# Patient Record
Sex: Male | Born: 1970 | Race: Black or African American | Hispanic: No | Marital: Married | Smoking: Never smoker
Health system: Southern US, Community
[De-identification: ages and names within clinical notes are randomized; demographics above are authoritative.]

## PROBLEM LIST (undated history)

## (undated) DIAGNOSIS — E876 Hypokalemia: Secondary | ICD-10-CM

## (undated) DIAGNOSIS — I4891 Unspecified atrial fibrillation: Secondary | ICD-10-CM

## (undated) DIAGNOSIS — I472 Ventricular tachycardia, unspecified: Secondary | ICD-10-CM

## (undated) DIAGNOSIS — R7989 Other specified abnormal findings of blood chemistry: Secondary | ICD-10-CM

## (undated) DIAGNOSIS — I428 Other cardiomyopathies: Secondary | ICD-10-CM

## (undated) DIAGNOSIS — E669 Obesity, unspecified: Secondary | ICD-10-CM

## (undated) DIAGNOSIS — I10 Essential (primary) hypertension: Secondary | ICD-10-CM

## (undated) DIAGNOSIS — K219 Gastro-esophageal reflux disease without esophagitis: Secondary | ICD-10-CM

## (undated) DIAGNOSIS — I509 Heart failure, unspecified: Secondary | ICD-10-CM

## (undated) DIAGNOSIS — Z9119 Patient's noncompliance with other medical treatment and regimen: Secondary | ICD-10-CM

## (undated) DIAGNOSIS — G4733 Obstructive sleep apnea (adult) (pediatric): Secondary | ICD-10-CM

## (undated) DIAGNOSIS — Z91199 Patient's noncompliance with other medical treatment and regimen due to unspecified reason: Secondary | ICD-10-CM

---

## 1989-04-29 HISTORY — PX: ABDOMINAL SURGERY: SHX537

## 2006-08-29 HISTORY — PX: CARDIAC DEFIBRILLATOR PLACEMENT: SHX171

## 2013-04-26 ENCOUNTER — Encounter (HOSPITAL_COMMUNITY)
Admission: EM | Disposition: A | Discharge status: Home / Self Care | Payer: Self-pay | Source: Home / Self Care | Attending: Internal Medicine

## 2013-04-26 ENCOUNTER — Inpatient Hospital Stay (HOSPITAL_COMMUNITY): Payer: Medicaid Other | Admitting: Anesthesiology

## 2013-04-26 ENCOUNTER — Encounter (HOSPITAL_COMMUNITY): Payer: Self-pay | Admitting: Anesthesiology

## 2013-04-26 ENCOUNTER — Encounter (HOSPITAL_COMMUNITY): Payer: Self-pay | Admitting: Emergency Medicine

## 2013-04-26 ENCOUNTER — Inpatient Hospital Stay (HOSPITAL_COMMUNITY)
Admission: EM | Admit: 2013-04-26 | Discharge: 2013-04-27 | DRG: 314 | Disposition: A | Discharge status: Home / Self Care | Payer: Medicaid Other | Attending: Internal Medicine | Admitting: Internal Medicine

## 2013-04-26 ENCOUNTER — Emergency Department (HOSPITAL_COMMUNITY): Payer: Medicaid Other

## 2013-04-26 DIAGNOSIS — E876 Hypokalemia: Secondary | ICD-10-CM | POA: Diagnosis present

## 2013-04-26 DIAGNOSIS — Y9210 Unspecified residential institution as the place of occurrence of the external cause: Secondary | ICD-10-CM | POA: Diagnosis present

## 2013-04-26 DIAGNOSIS — G4733 Obstructive sleep apnea (adult) (pediatric): Secondary | ICD-10-CM | POA: Diagnosis present

## 2013-04-26 DIAGNOSIS — I480 Paroxysmal atrial fibrillation: Secondary | ICD-10-CM

## 2013-04-26 DIAGNOSIS — I472 Ventricular tachycardia, unspecified: Secondary | ICD-10-CM

## 2013-04-26 DIAGNOSIS — T82897A Other specified complication of cardiac prosthetic devices, implants and grafts, initial encounter: Principal | ICD-10-CM | POA: Diagnosis present

## 2013-04-26 DIAGNOSIS — I509 Heart failure, unspecified: Secondary | ICD-10-CM | POA: Diagnosis present

## 2013-04-26 DIAGNOSIS — Z9119 Patient's noncompliance with other medical treatment and regimen: Secondary | ICD-10-CM

## 2013-04-26 DIAGNOSIS — I4901 Ventricular fibrillation: Secondary | ICD-10-CM

## 2013-04-26 DIAGNOSIS — Z4502 Encounter for adjustment and management of automatic implantable cardiac defibrillator: Secondary | ICD-10-CM

## 2013-04-26 DIAGNOSIS — I428 Other cardiomyopathies: Secondary | ICD-10-CM

## 2013-04-26 DIAGNOSIS — Z91199 Patient's noncompliance with other medical treatment and regimen due to unspecified reason: Secondary | ICD-10-CM

## 2013-04-26 DIAGNOSIS — Z8249 Family history of ischemic heart disease and other diseases of the circulatory system: Secondary | ICD-10-CM

## 2013-04-26 DIAGNOSIS — Z95810 Presence of automatic (implantable) cardiac defibrillator: Secondary | ICD-10-CM

## 2013-04-26 DIAGNOSIS — I4891 Unspecified atrial fibrillation: Secondary | ICD-10-CM | POA: Diagnosis present

## 2013-04-26 DIAGNOSIS — Z6839 Body mass index (BMI) 39.0-39.9, adult: Secondary | ICD-10-CM

## 2013-04-26 DIAGNOSIS — I4729 Other ventricular tachycardia: Secondary | ICD-10-CM | POA: Diagnosis present

## 2013-04-26 DIAGNOSIS — E669 Obesity, unspecified: Secondary | ICD-10-CM | POA: Diagnosis present

## 2013-04-26 DIAGNOSIS — I10 Essential (primary) hypertension: Secondary | ICD-10-CM | POA: Diagnosis present

## 2013-04-26 DIAGNOSIS — Z833 Family history of diabetes mellitus: Secondary | ICD-10-CM

## 2013-04-26 DIAGNOSIS — Y831 Surgical operation with implant of artificial internal device as the cause of abnormal reaction of the patient, or of later complication, without mention of misadventure at the time of the procedure: Secondary | ICD-10-CM | POA: Diagnosis present

## 2013-04-26 HISTORY — DX: Patient's noncompliance with other medical treatment and regimen due to unspecified reason: Z91.199

## 2013-04-26 HISTORY — DX: Other specified abnormal findings of blood chemistry: R79.89

## 2013-04-26 HISTORY — DX: Essential (primary) hypertension: I10

## 2013-04-26 HISTORY — DX: Hypokalemia: E87.6

## 2013-04-26 HISTORY — DX: Ventricular tachycardia, unspecified: I47.20

## 2013-04-26 HISTORY — DX: Ventricular tachycardia: I47.2

## 2013-04-26 HISTORY — DX: Obstructive sleep apnea (adult) (pediatric): G47.33

## 2013-04-26 HISTORY — DX: Heart failure, unspecified: I50.9

## 2013-04-26 HISTORY — PX: IMPLANTABLE CARDIOVERTER DEFIBRILLATOR THRESHOLD CHECK: SHX5472

## 2013-04-26 HISTORY — DX: Obesity, unspecified: E66.9

## 2013-04-26 HISTORY — DX: Other cardiomyopathies: I42.8

## 2013-04-26 HISTORY — DX: Patient's noncompliance with other medical treatment and regimen: Z91.19

## 2013-04-26 HISTORY — DX: Unspecified atrial fibrillation: I48.91

## 2013-04-26 LAB — PROTIME-INR: Prothrombin Time: 13.2 seconds (ref 11.6–15.2)

## 2013-04-26 LAB — BASIC METABOLIC PANEL
BUN: 13 mg/dL (ref 6–23)
CO2: 28 mEq/L (ref 19–32)
Calcium: 9.6 mg/dL (ref 8.4–10.5)
Chloride: 99 mEq/L (ref 96–112)
Creatinine, Ser: 1.15 mg/dL (ref 0.50–1.35)
GFR calc Af Amer: 88 mL/min — ABNORMAL LOW (ref >90)
GFR calc non Af Amer: 76 mL/min — ABNORMAL LOW (ref >90)
GFR calc non Af Amer: >90 mL/min (ref >90)
Glucose, Bld: 112 mg/dL — ABNORMAL HIGH (ref 70–99)
Potassium: 3.1 mEq/L — ABNORMAL LOW (ref 3.5–5.1)

## 2013-04-26 LAB — CBC
MCH: 30 pg (ref 26.0–34.0)
MCHC: 34.5 g/dL (ref 30.0–36.0)
MCHC: 34.2 g/dL (ref 30.0–36.0)
MCV: 87.6 fL (ref 78.0–100.0)
Platelets: 227 10*3/uL (ref 150–400)
Platelets: 210 10*3/uL (ref 150–400)
RDW: 13.1 % (ref 11.5–15.5)
WBC: 5 10*3/uL (ref 4.0–10.5)

## 2013-04-26 LAB — TROPONIN I: Troponin I: <0.3 ng/mL (ref <0.30)

## 2013-04-26 LAB — MAGNESIUM: Magnesium: 2.1 mg/dL (ref 1.5–2.5)

## 2013-04-26 LAB — POCT I-STAT TROPONIN I: Troponin i, poc: 0.07 ng/mL (ref 0.00–0.08)

## 2013-04-26 LAB — CREATININE, SERUM: Creatinine, Ser: 0.92 mg/dL (ref 0.50–1.35)

## 2013-04-26 LAB — MRSA PCR SCREENING: MRSA by PCR: NEGATIVE

## 2013-04-26 SURGERY — IMPLANTABLE CARDIOVERTER DEFIBRILLATOR THRESHOLD CHECK
Anesthesia: Monitor Anesthesia Care

## 2013-04-26 MED ORDER — MIDAZOLAM HCL 5 MG/5ML IJ SOLN
INTRAMUSCULAR | Status: DC | PRN
  Administered 2013-04-26: 2 mg via INTRAVENOUS

## 2013-04-26 MED ORDER — METOPROLOL SUCCINATE ER 100 MG PO TB24
200.0000 mg | ORAL_TABLET | Freq: Every day | ORAL | Status: DC
Start: 2013-04-26 — End: 2013-04-26

## 2013-04-26 MED ORDER — MAGNESIUM OXIDE 400 MG PO TABS: 400.0000 mg | ORAL_TABLET | Freq: Every day | ORAL | Status: DC

## 2013-04-26 MED ORDER — SODIUM CHLORIDE 0.9 % IJ SOLN
3.0000 mL | Freq: Two times a day (BID) | INTRAMUSCULAR | Status: DC
  Administered 2013-04-26 – 2013-04-27 (×2): 3 mL via INTRAVENOUS

## 2013-04-26 MED ORDER — FUROSEMIDE 40 MG PO TABS
40.0000 mg | ORAL_TABLET | Freq: Two times a day (BID) | ORAL | Status: DC
  Administered 2013-04-27: 40 mg via ORAL
  Filled 2013-04-26 (×5): qty 1

## 2013-04-26 MED ORDER — SODIUM CHLORIDE 0.9 % IV SOLN
INTRAVENOUS | Status: DC | PRN
  Administered 2013-04-26: 16:00:00 via INTRAVENOUS

## 2013-04-26 MED ORDER — ASPIRIN EC 81 MG PO TBEC
81.0000 mg | DELAYED_RELEASE_TABLET | Freq: Every day | ORAL | Status: DC
  Administered 2013-04-27: 81 mg via ORAL
  Filled 2013-04-26: qty 1

## 2013-04-26 MED ORDER — ACETAMINOPHEN 325 MG PO TABS: 650.0000 mg | ORAL_TABLET | ORAL | Status: DC | PRN

## 2013-04-26 MED ORDER — ONDANSETRON HCL 4 MG/2ML IJ SOLN: 4.0000 mg | Freq: Four times a day (QID) | INTRAMUSCULAR | Status: DC | PRN

## 2013-04-26 MED ORDER — METOPROLOL SUCCINATE ER 100 MG PO TB24
200.0000 mg | ORAL_TABLET | Freq: Every day | ORAL | Status: DC
  Administered 2013-04-26 – 2013-04-27 (×2): 200 mg via ORAL
  Filled 2013-04-26 (×3): qty 2

## 2013-04-26 MED ORDER — POTASSIUM CHLORIDE CRYS ER 20 MEQ PO TBCR
40.0000 meq | EXTENDED_RELEASE_TABLET | Freq: Once | ORAL | Status: AC
  Administered 2013-04-26: 40 meq via ORAL
  Filled 2013-04-26: qty 2

## 2013-04-26 MED ORDER — SOTALOL HCL 120 MG PO TABS
120.0000 mg | ORAL_TABLET | Freq: Two times a day (BID) | ORAL | Status: DC
  Administered 2013-04-26 – 2013-04-27 (×2): 120 mg via ORAL
  Filled 2013-04-26 (×5): qty 1

## 2013-04-26 MED ORDER — NITROGLYCERIN 0.4 MG SL SUBL: 0.4000 mg | SUBLINGUAL_TABLET | SUBLINGUAL | Status: DC | PRN

## 2013-04-26 MED ORDER — POTASSIUM CHLORIDE CRYS ER 20 MEQ PO TBCR
40.0000 meq | EXTENDED_RELEASE_TABLET | Freq: Two times a day (BID) | ORAL | Status: DC
  Administered 2013-04-26 – 2013-04-27 (×2): 40 meq via ORAL
  Filled 2013-04-26 (×3): qty 2

## 2013-04-26 MED ORDER — SODIUM CHLORIDE 0.9 % IJ SOLN: 3.0000 mL | INTRAMUSCULAR | Status: DC | PRN

## 2013-04-26 MED ORDER — MAGNESIUM OXIDE 400 (241.3 MG) MG PO TABS
400.0000 mg | ORAL_TABLET | Freq: Every day | ORAL | Status: DC
  Administered 2013-04-27: 400 mg via ORAL
  Filled 2013-04-26 (×3): qty 1

## 2013-04-26 MED ORDER — HYDRALAZINE HCL 25 MG PO TABS
25.0000 mg | ORAL_TABLET | Freq: Three times a day (TID) | ORAL | Status: DC
  Administered 2013-04-26 – 2013-04-27 (×2): 25 mg via ORAL
  Filled 2013-04-26 (×6): qty 1

## 2013-04-26 MED ORDER — HEPARIN SODIUM (PORCINE) 5000 UNIT/ML IJ SOLN
5000.0000 [IU] | Freq: Three times a day (TID) | INTRAMUSCULAR | Status: DC
  Filled 2013-04-26 (×6): qty 1

## 2013-04-26 MED ORDER — SPIRONOLACTONE 25 MG PO TABS
25.0000 mg | ORAL_TABLET | Freq: Every day | ORAL | Status: DC
  Administered 2013-04-26 – 2013-04-27 (×2): 25 mg via ORAL
  Filled 2013-04-26 (×3): qty 1

## 2013-04-26 MED ORDER — SODIUM CHLORIDE 0.9 % IV SOLN: 250.0000 mL | INTRAVENOUS | Status: DC | PRN

## 2013-04-26 MED ORDER — LISINOPRIL 40 MG PO TABS
40.0000 mg | ORAL_TABLET | Freq: Every day | ORAL | Status: DC
Start: 2013-04-26 — End: 2013-04-27
  Administered 2013-04-26 – 2013-04-27 (×2): 40 mg via ORAL
  Filled 2013-04-26 (×5): qty 1

## 2013-04-26 MED ORDER — PROPOFOL INFUSION 10 MG/ML OPTIME
INTRAVENOUS | Status: DC | PRN
  Administered 2013-04-26: 75 ug/kg/min via INTRAVENOUS
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
## 2013-04-26 NOTE — Progress Notes (Signed)
ED CM noted medicaid pt without pcp listed and without a recent scanned medicaid card in EPIC.  Notes indicates pt recently moved to this area but without obtaining, updating pcp status.  Pt needs referral back to DSS for assistance with locating an accepting medicaid provider in [place] and to transfer [employer] from county to county as needed  Cm spoke with Clydie Braun [facility] ED RN who will discuss this with the pt

## 2013-04-26 NOTE — ED Notes (Signed)
Pt has taken own meds prior to nurse entering room-- SOTALOL 80 mg 1 1/2 tab and HYDRALAZINE 25mg . States  takes meds on time when at home.

## 2013-04-26 NOTE — Transfer of Care (Signed)
Immediate Anesthesia Transfer of Care Note  Patient: Kyle Vincent  Procedure(s) Performed: Procedure(s): IMPLANTABLE CARDIOVERTER DEFIBRILLATOR THRESHOLD CHECK (N/A)  Patient Location: PACU  Anesthesia Type:MAC  Level of Consciousness: awake, alert  and oriented  Airway & Oxygen Therapy: Patient Spontanous Breathing and Patient connected to nasal cannula oxygen  Post-op Assessment: Report given to PACU RN, Post -op Vital signs reviewed and stable and Patient moving all extremities X 4  Post vital signs: Reviewed and stable  Complications: No apparent anesthesia complications

## 2013-04-26 NOTE — ED Notes (Signed)
Pt states that his AICD is also a pacemaker.

## 2013-04-26 NOTE — Preoperative (Signed)
Beta Blockers   Reason not to administer Beta Blockers:Sotalol taken at 9:00am on 04/26/13

## 2013-04-26 NOTE — ED Notes (Signed)
Using bedside commode--wife remains at bedside-- denies any pain, RSR -- with multifocal PVCs.

## 2013-04-26 NOTE — Anesthesia Preprocedure Evaluation (Addendum)
Anesthesia Evaluation  Patient identified by MRN, date of birth, ID band Patient awake    Reviewed: Allergy & Precautions, H&P , NPO status , Patient's Chart, lab work & pertinent test results, reviewed documented beta blocker date and time   Airway Mallampati: II TM Distance: >3 FB Neck ROM: Full    Dental  (+) Teeth Intact   Pulmonary  breath sounds clear to auscultation        Cardiovascular hypertension, Pt. on home beta blockers +CHF + dysrhythmias Atrial Fibrillation and Ventricular Tachycardia + Cardiac Defibrillator Rhythm:Regular Rate:Normal     Neuro/Psych    GI/Hepatic   Endo/Other  Morbid obesity  Renal/GU      Musculoskeletal   Abdominal (+) + obese,   Peds  Hematology   Anesthesia Other Findings   Reproductive/Obstetrics                         Anesthesia Physical Anesthesia Plan  ASA: III  Anesthesia Plan: MAC   Post-op Pain Management:    Induction: Intravenous  Airway Management Planned: Simple Face Mask  Additional Equipment:   Intra-op Plan:   Post-operative Plan:   Informed Consent: I have reviewed the patients History and Physical, chart, labs and discussed the procedure including the risks, benefits and alternatives for the proposed anesthesia with the patient or authorized representative who has indicated his/her understanding and acceptance.     Plan Discussed with: CRNA and Surgeon  Anesthesia Plan Comments:         Anesthesia Quick Evaluation

## 2013-04-26 NOTE — Brief Op Note (Addendum)
DFT testing performed Riata ST lead externalization observed  Successful defibrillation of VF with adequate detection and DFT of <21J.  See dictation # [id number] for full details.  Anticipate discharge to home in AM. He has scheduled follow-up with Dr [name] this Tuesday  (04/30/13) at 11:30 am to discuss potential lead extraction and new lead placement given the patients young age and recurrent VT/VF.  No driving x 6 months (patient is aware).
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
## 2013-04-26 NOTE — Anesthesia Procedure Notes (Signed)
Procedure Name: MAC Date/Time: 04/26/2013 4:15 PM Performed by: Quentin Ore Pre-anesthesia Checklist: Patient identified, Emergency Drugs available, Suction available, Patient being monitored and Timeout performed Patient Re-evaluated:Patient Re-evaluated prior to inductionOxygen Delivery Method: Simple face mask Preoxygenation: Pre-oxygenation with 100% oxygen Intubation Type: IV induction Placement Confirmation: positive ETCO2

## 2013-04-26 NOTE — ED Notes (Signed)
This RN spoke with a representative at [facility], where the pt had his AICD placed, to try and locate the AICD's serial number so the interrogation of the device that was done today can be faxed to our facility. This RN gave the representative the ED main phone number, and she said that she would call back with the appropriate information.

## 2013-04-26 NOTE — Progress Notes (Signed)
Pt alert and oriented ,VSS monitor SR

## 2013-04-26 NOTE — Anesthesia Postprocedure Evaluation (Signed)
  Anesthesia Post-op Note  Patient: Kyle Vincent  Procedure(s) Performed: Procedure(s): IMPLANTABLE CARDIOVERTER DEFIBRILLATOR THRESHOLD CHECK (N/A)  Patient Location: Cath Lab  Anesthesia Type:MAC  Level of Consciousness: awake, alert  and oriented  Airway and Oxygen Therapy: Patient Spontanous Breathing  Post-op Pain: none  Post-op Assessment: Post-op Vital signs reviewed, Patient's Cardiovascular Status Stable, Respiratory Function Stable and Patent Airway  Post-op Vital Signs: Reviewed and stable  Complications: No apparent anesthesia complications

## 2013-04-26 NOTE — ED Notes (Signed)
Pt BIB EMS.  Report per pt's wife. Pt was asleep, and when wife awoke, saw that the pt was "unresponsive" and was having difficulty breathing. Pt's wife then reports that she heard the pt's AICD fire twice, after which the pt came around.  Pt does not remember any of these events.

## 2013-04-27 ENCOUNTER — Encounter (HOSPITAL_COMMUNITY): Payer: Self-pay | Admitting: Physician Assistant

## 2013-04-27 DIAGNOSIS — Z0389 Encounter for observation for other suspected diseases and conditions ruled out: Secondary | ICD-10-CM

## 2013-04-27 LAB — LIPID PANEL
Cholesterol: 159 mg/dL (ref 0–200)
LDL Cholesterol: 103 mg/dL — ABNORMAL HIGH (ref 0–99)
VLDL: 21 mg/dL (ref 0–40)

## 2013-04-27 LAB — COMPREHENSIVE METABOLIC PANEL
Albumin: 3.3 g/dL — ABNORMAL LOW (ref 3.5–5.2)
Alkaline Phosphatase: 58 U/L (ref 39–117)
BUN: 15 mg/dL (ref 6–23)
CO2: 25 mEq/L (ref 19–32)
Chloride: 105 mEq/L (ref 96–112)
GFR calc non Af Amer: >90 mL/min (ref >90)
Potassium: 3.9 mEq/L (ref 3.5–5.1)
Total Bilirubin: 0.7 mg/dL (ref 0.3–1.2)

## 2013-04-27 MED ORDER — RIVAROXABAN 20 MG PO TABS: 20.0000 mg | ORAL_TABLET | Freq: Every day | ORAL | Status: DC

## 2013-04-27 MED ORDER — POTASSIUM CHLORIDE CRYS ER 20 MEQ PO TBCR: 40.0000 meq | EXTENDED_RELEASE_TABLET | Freq: Two times a day (BID) | ORAL | Status: DC
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
## 2013-04-27 NOTE — Progress Notes (Signed)
Pt has refused his heparin sq injection during this 7p-7a shift saying he is only going to be here for one night so he does not need it and, if he were to be here for more than a night then he would take it. Pt was instructed on the importance of him receiving this medication as it is a part of his plan of care and directed by the MD.

## 2013-04-27 NOTE — Progress Notes (Signed)
Nutrition Brief Note  Patient identified on the Malnutrition Screening Tool (MST) Report. Pt identified as unsure if he had weight loss, however denies poor appetite.  Wt Readings from Last 15 Encounters:  04/27/13 297 lb 6.4 oz (134.9 kg)  04/27/13 297 lb 6.4 oz (134.9 kg)    Body mass index is 39.25 kg/(m^2). Patient meets criteria for Obese Class II based on current BMI.   Current diet order is Heart Healthy. Labs and medications reviewed.   No nutrition interventions warranted at this time. If nutrition issues arise, please consult RD.   Jarold Motto MS, RD, LDN Pager: [phone] After-hours pager: [phone]

## 2013-04-27 NOTE — Progress Notes (Signed)
Pt explained discharge instructions; all questions answered; medications reviewed and follow-up appointments reviewed; pt alert and oriented; IV removed with no complications noted; patient transported by wheelchair to main entrance for discharge home

## 2013-04-27 NOTE — Op Note (Signed)
Kyle Vincent, Kyle Vincent NO.:  [id number]  MEDICAL RECORD NO.:  [id number]  LOCATION:  2C05C                        FACILITY:  [facility]  PHYSICIAN:  [name], MD       DATE OF BIRTH:  Jun 28, 1971  DATE OF PROCEDURE:  04/26/2013 DATE OF DISCHARGE:                              OPERATIVE REPORT   PREPROCEDURE DIAGNOSES: 1. Ventricular tachycardia and ventricular fibrillation. 2. ICD malfunction.  POSTPROCEDURE DIAGNOSES: 1. Ventricular tachycardia and ventricular fibrillation. 2. ICD undersensing of ventricular fibrillation. 3. Riata defibrillator lead with wire externalization  PROCEDURES: 1. Defibrillation threshold testing. 2. ICD system fluoroscopy.  INTRODUCTION:  Kyle Vincent is a pleasant 42 year old gentleman with a nonischemic cardiomyopathy and prior appropriate therapy for ventricular tachycardia and ventricular fibrillation.  He had his ICD implanted at [facility] previously by Dr. [name] on February 27, 2007.  He apparently had some T-wave over sensing and therefore had his device reprogramed with threshold start at 75% and decay delay of 125.  Last night, the patient had ventricular tachycardia for which he did not adequately sent due to sensing programing.  His ventricular tachycardia then degenerated into ventricular fibrillation. He did eventually detect ventricular fibrillation.  A 20-joule shock was unsuccessful followed by an unsuccessful 25-joule shock.  He then received a 36-joule shock, which successfully defibrillated the patient.  He is now brought to [facility] for further management.  It was felt prudent at this time to re-program his device and proceed with defibrillation threshold testing.  DESCRIPTION OF PROCEDURE:  Informed written consent was obtained and the patient was brought to the electrophysiology lab in the fasting state. He was adequately sedated with propofol as outlined in the anesthesia report.  Adequate IV access and airway  support were assured. Fluoroscopy was then performed of the patient's defibrillator system. This demonstrated a dual-chamber ICD in place.  The patient had an 1688 atrial lead observed in the free wall of the right atrium.  He also had a Web designer Riata ST 7000 lead fixed into the right ventricular apical septum.  There was observed externalization of the distal portion of the defibrillator lead conductor.  The patient's defibrillator was programed back to nominal sensing initially with the threshold start returned to 62% and decay delay of 60%.  Atrial pacing was performed multiple times in various heart rates and we could not demonstrate inappropriate T-wave sensing today.  I elected to perform DFT testing initially with a sensitivity of 0.8 mV. Ventricular fibrillation was induced with a T-wave shock.  Adequate sensing of ventricular fibrillation was observed.  However, the patient did have some dropout.  A single 24-joule shock (700 V) was successful in defibrillating the patient without a delay in time to detection/ shock delivery.  After a 5-minute recovery phase, the patient's shock waveform was adjusted with a first phase tilt of 4.0 milliseconds and a second phase of 3.5 milliseconds to optimize his waveform.  Sensitivity was then changed to 0.4 mV. Ventricular fibrillation was again induced.  Adequate sensing of ventricular fibrillation was observed with a very little dropout.  A single 21-joule shock was successful in defibrillating the patient (650 V).  The impedance was 42 ohms with a charge time of 6.1 seconds.  The patient remained in sinus rhythm thereafter.  There were no early apparent complications.  CONCLUSIONS: 1. Externalization of a Riata ST lead is observed. 2. Successful reprograming of the device today to adequately sense     ventricular fibrillation. 3. DFT today is less than or equal to 21 joules. 4. No early apparent  complications.  RECOMMENDATIONS:  I had a discussion with Dr. [name] about the patient's externalized lead.  As he has had prior appropriate therapies and given the patient's young age, I think that it may be very appropriate to consider extraction of this lead and placement of a new defibrillator lead.  I will arrange for the patient to be evaluated next week by Dr. [name] in the office where this discussion can be had further.     [name], MD     JA/MEDQ  D:  04/26/2013  T:  04/27/2013  Job:  [id number]  cc:   Dr. Dede Query
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
## 2013-04-30 ENCOUNTER — Ambulatory Visit (INDEPENDENT_AMBULATORY_CARE_PROVIDER_SITE_OTHER): Payer: Medicaid Other | Admitting: Internal Medicine

## 2013-04-30 ENCOUNTER — Encounter: Payer: Self-pay | Admitting: Internal Medicine

## 2013-04-30 VITALS — BP 132/90 | HR 68 | Ht 71.0 in | Wt 294.0 lb

## 2013-04-30 DIAGNOSIS — I472 Ventricular tachycardia: Secondary | ICD-10-CM

## 2013-04-30 DIAGNOSIS — T82118D Breakdown (mechanical) of other cardiac electronic device, subsequent encounter: Secondary | ICD-10-CM

## 2013-04-30 DIAGNOSIS — Z5189 Encounter for other specified aftercare: Secondary | ICD-10-CM

## 2013-04-30 DIAGNOSIS — Z95810 Presence of automatic (implantable) cardiac defibrillator: Secondary | ICD-10-CM | POA: Insufficient documentation

## 2013-04-30 DIAGNOSIS — I428 Other cardiomyopathies: Secondary | ICD-10-CM

## 2013-04-30 DIAGNOSIS — I5022 Chronic systolic (congestive) heart failure: Secondary | ICD-10-CM | POA: Insufficient documentation

## 2013-04-30 NOTE — Assessment & Plan Note (Signed)
He has a combination of difficult problems. I have discussed the issues with the patient and his wife. He has elevated thresholds requiring sotalol. He has had T wave oversensing requiring reprogramming which resulting in undersensing of his ventricular arrhythmias. He has been reprogrammed and now has appropriate sensing of VT. I have not changed his programming. I have discussed the issues of externalization of his ICD lead. For now, I have recommended he undergo watchful waiting. When his device reaches ERI, we will then decide on a new lead vs removal of his old lead and insertion of a new one.

## 2013-04-30 NOTE — Assessment & Plan Note (Signed)
His heart failure symptoms are well controlled, class 2. No change in medical or dietary therapy.
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
## 2013-04-30 NOTE — Patient Instructions (Addendum)
Your physician recommends that you schedule a follow-up appointment in: 3 months with Dr Taylor  

## 2013-05-03 ENCOUNTER — Telehealth (HOSPITAL_COMMUNITY): Payer: Self-pay | Admitting: Cardiology

## 2013-05-03 DIAGNOSIS — I5022 Chronic systolic (congestive) heart failure: Secondary | ICD-10-CM

## 2013-05-03 NOTE — Telephone Encounter (Signed)
Opened in ERROR

## 2013-05-03 NOTE — Telephone Encounter (Signed)
Order placed for upcoming ECHo

## 2013-05-28 ENCOUNTER — Ambulatory Visit (HOSPITAL_COMMUNITY)
Admission: RE | Admit: 2013-05-28 | Discharge: 2013-05-28 | Disposition: A | Discharge status: Home / Self Care | Payer: Medicaid Other | Source: Ambulatory Visit | Attending: Internal Medicine | Admitting: Internal Medicine

## 2013-05-28 ENCOUNTER — Ambulatory Visit (HOSPITAL_COMMUNITY)
Admission: RE | Admit: 2013-05-28 | Discharge: 2013-05-28 | Disposition: A | Discharge status: Home / Self Care | Payer: Medicaid Other | Source: Ambulatory Visit | Attending: Cardiology | Admitting: Cardiology

## 2013-05-28 VITALS — BP 130/84 | HR 70 | Wt 294.0 lb

## 2013-05-28 DIAGNOSIS — G4733 Obstructive sleep apnea (adult) (pediatric): Secondary | ICD-10-CM | POA: Insufficient documentation

## 2013-05-28 DIAGNOSIS — I5022 Chronic systolic (congestive) heart failure: Secondary | ICD-10-CM

## 2013-05-28 DIAGNOSIS — I4891 Unspecified atrial fibrillation: Secondary | ICD-10-CM | POA: Insufficient documentation

## 2013-05-28 DIAGNOSIS — I369 Nonrheumatic tricuspid valve disorder, unspecified: Secondary | ICD-10-CM

## 2013-05-28 DIAGNOSIS — Z950 Presence of cardiac pacemaker: Secondary | ICD-10-CM | POA: Insufficient documentation

## 2013-05-28 DIAGNOSIS — I10 Essential (primary) hypertension: Secondary | ICD-10-CM | POA: Insufficient documentation

## 2013-05-28 DIAGNOSIS — Z79899 Other long term (current) drug therapy: Secondary | ICD-10-CM | POA: Insufficient documentation

## 2013-05-28 DIAGNOSIS — R946 Abnormal results of thyroid function studies: Secondary | ICD-10-CM | POA: Insufficient documentation

## 2013-05-28 DIAGNOSIS — I472 Ventricular tachycardia: Secondary | ICD-10-CM

## 2013-05-28 DIAGNOSIS — I509 Heart failure, unspecified: Secondary | ICD-10-CM | POA: Insufficient documentation

## 2013-05-28 DIAGNOSIS — I428 Other cardiomyopathies: Secondary | ICD-10-CM

## 2013-05-28 MED ORDER — METOPROLOL SUCCINATE ER 200 MG PO TB24: 200.0000 mg | ORAL_TABLET | Freq: Every day | ORAL | Status: DC

## 2013-05-28 MED ORDER — ISOSORBIDE MONONITRATE ER 30 MG PO TB24: 30.0000 mg | ORAL_TABLET | Freq: Every day | ORAL | Status: DC

## 2013-05-28 MED ORDER — SPIRONOLACTONE 25 MG PO TABS: 25.0000 mg | ORAL_TABLET | Freq: Every day | ORAL | Status: DC

## 2013-05-28 NOTE — Progress Notes (Signed)
  Echocardiogram 2D Echocardiogram has been performed.  Kyle Vincent 05/28/2013, 1:12 PM

## 2013-05-28 NOTE — Patient Instructions (Addendum)
Follow up in 3 months   Do the following things EVERYDAY: 1) Weigh yourself in the morning before breakfast. Write it down and keep it in a log. 2) Take your medicines as prescribed 3) Eat low salt foods-Limit salt (sodium) to 2000 mg per day.  4) Stay as active as you can everyday 5) Limit all fluids for the day to less than 2 liters  

## 2013-05-28 NOTE — Progress Notes (Signed)
Patient ID: Kyle Vincent, male   DOB: [birth year], 42 y.o.   MRN: [id number]  Weight Range  297 pounds  Baseline proBNP   EP: Dr [name]  HPI: Kyle Vincent is a 42 year old male with a history of non-ischemic cardiomyopathy by report who is s/p dual chamber ICD insertion in July of 2008 for secondary prevention. His other past medical history is significant for atrial fibrillation and inappropriate therapy for TWOS (t-wave oversensing). He reported diagnosis of congestive heart failure and hypertension in 2007.  NICM is thought to be due to HTN.   He was placed on medication, but per his report was non compliant. He subsequently had a syncopal spell in 2008 while wrestling with his cousin. [name] He had ROSC and EMS was called. His ICD was placed at that time. He has been maintained on good medical therapy since that time. He says that he had a cardiac catheterization last in 2012 which did not show significant CAD.  He has received inappropriate therapy for TWOS. He has also had appropriate therapy for ventricular tachycardia and has been maintained on sotalol. He was also found to have atrial fibrillation through device interrogation in the past and was previously on warfarin, now on Xarelto.  Admitted [facility] 8/29/ through  04/27/13 after his device fired. He was found to have undersensing of VT with decay to ventricular fibrillation and appropriate therapy for VF at that time.  He had been off sotalol and toprol for 2 days. Defibrillator was reprogrammed. Discharge weight 297 pounds.   He was referred to CHF clinic for evaluation. Relocated to [place] 2 months ago. Had previously been followed at [facility]. Denies SOB/PND/Orthopnea. Not currently using CPAP because it is broken. Taking all medications. Not exercising.  BP is under good control.   Labs (8/14): K 3.9, Creatinine 0.98, Cholesterol 159, Triglycerides 106, HDL 35, LDL 103, TSH low, free T4 normal  ECG: NSR, normal QT interval  SH: Married with 5 children.  Disabled He does not smoke or drink alcohol.  Originally from [place].  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   FH: Father had heart disease (not sure of details)  ROS: All systems negative except as listed in HPI, PMH and Problem List.  PMH: 1. Nonischemic CMP: Possibly due to uncontrolled HTN x years.  LHC (2012) per patient's report did not show significant coronary disease.  Echo (9/14) with EF 25-30%, moderately dilated LV, mild LVH, mildly decreased RV systolic function.  2. St Jude dual chamber ICD: Patient had problems initially with T wave oversensing.  Then was admitted in 8/14 with undersensing of VT that resulted in degeneration to VF and ICD discharge.  ICD reprogrammed.  3. Ventricular tachycardia: On sotalol.  4. Atrial fibrillation: Paroxysmal, noted by ICD interrogation. He is on Xarelto.  5. HTN 6. OSA: CPAP is broken.   Current Outpatient Prescriptions  Medication Sig Dispense Refill  . furosemide (LASIX) 40 MG tablet Take 40 mg by mouth 2 (two) times daily.      . hydrALAZINE (APRESOLINE) 25 MG tablet Take 25 mg by mouth 3 (three) times daily.      [name] lisinopril (PRINIVIL,ZESTRIL) 40 MG tablet Take 40 mg by mouth daily.      . magnesium oxide (MAG-OX) 400 MG tablet Take 400 mg by mouth daily.      . metoprolol (TOPROL-XL) 200 MG 24 hr tablet Take 200 mg by mouth daily.      . potassium chloride SA (K-DUR,KLOR-CON) 20 MEQ tablet Take  2 tablets (40 mEq total) by mouth 2 (two) times daily.      . Rivaroxaban (XARELTO) 20 MG TABS tablet Take 1 tablet (20 mg total) by mouth daily with supper.  30 tablet  2  . sotalol (BETAPACE) 80 MG tablet Take 120 mg by mouth 2 (two) times daily. Take 1 & 1/2 tablet daily.      [name] spironolactone (ALDACTONE) 25 MG tablet Take 25 mg by mouth daily.       No current facility-administered medications for this encounter.     PHYSICAL EXAM: Filed Vitals:   05/28/13 1146  BP: 130/84  Pulse: 70  Weight: 294 lb (133.358 kg)  SpO2: 99%    General:   Well appearing. No resp difficulty HEENT: normal Neck: supple. JVP flat. Carotids 2+ bilaterally; no bruits. No lymphadenopathy or thryomegaly appreciated. Cor: PMI normal. Regular rate & rhythm. No rubs, gallops or murmurs. Lungs: clear Abdomen: soft, nontender, nondistended. No hepatosplenomegaly. No bruits or masses. Good bowel sounds. Extremities: no cyanosis, clubbing, rash, edema Neuro: alert & orientedx3, cranial nerves grossly intact. Moves all 4 extremities w/o difficulty. Affect pleasant.  ASSESSMENT & PLAN: 1. Chronic Systolic Heart Failure: Nonischemic cardiomyopathy. Suspect this was due to uncontrolled HTN for years.  Coronary angiography per his report in 2012 showed no obstructive CAD.  S/p dual chamber ICD insertion in July of 2008 for secondary prevention.  NYHA II symptoms, doing well, not volume overloaded on exam.  - Volume status stable. Continue lasix 40 mg twice a day and spironolactone 25 mg daily - Continue Toprol XL 200 mg daily - Continue hydralazine 25 mg tid and add Imdur 30 mg daily - Check ECHO today - Check BMET today - Obtain records from [facility].   2. History of ventricular tachycardia.  He is on sotalol.  QT interval on ECG today was normal.    3. Atrial fibrillation: Paroxysmal.  Continue Xarelto.  Sotalol to maintain NSR.   4. OSA: Needs to continue CPAP. Instructed to call DME company today for new cord.   5. Abnormal TSH: low TSH with normal free T4 on recent lab draw.  Will repeat TSH, free T4, free T3.    Follow up in 3 months with a BMET and Pro BNP   CLEGG,AMY 12:07 PM  Patient seen with NP, agree with the above note.  I reviewed his echo done today.  This showed EF 25-30% with mild LVH and moderate LV dilation.  He appears euvolemic on exam.  BP is controlled.  NYHA class II symptoms.  He will continue on sotalol for VT and atrial fibrillation, QT interval normal on ECG and in NSR. He will start Imdur 30 mg daily (inaddition to hydralazine).  He  will have room to titrate up hydralazine/Imdur.  Followup in 3 months.   Marca Ancona 05/28/2013 4:23 PM

## 2013-06-10 ENCOUNTER — Telehealth (HOSPITAL_COMMUNITY): Payer: Self-pay | Admitting: Cardiology

## 2013-06-10 DIAGNOSIS — R7989 Other specified abnormal findings of blood chemistry: Secondary | ICD-10-CM

## 2013-06-10 NOTE — Telephone Encounter (Signed)
Order placed for labs.

## 2013-06-13 ENCOUNTER — Other Ambulatory Visit: Payer: Medicaid Other

## 2013-06-19 ENCOUNTER — Other Ambulatory Visit (HOSPITAL_COMMUNITY): Payer: Self-pay | Admitting: Cardiology

## 2013-06-19 DIAGNOSIS — I5022 Chronic systolic (congestive) heart failure: Secondary | ICD-10-CM

## 2013-06-19 MED ORDER — POTASSIUM CHLORIDE CRYS ER 20 MEQ PO TBCR: 40.0000 meq | EXTENDED_RELEASE_TABLET | Freq: Two times a day (BID) | ORAL | Status: DC

## 2013-06-19 NOTE — Telephone Encounter (Signed)
Pt called to request refill Requested Prescriptions   Signed Prescriptions Disp Refills  . potassium chloride SA (K-DUR,KLOR-CON) 20 MEQ tablet 60 tablet 3    Sig: Take 2 tablets (40 mEq total) by mouth 2 (two) times daily.    Authorizing Provider: Dolores Patty    Ordering User: Baraa Tubbs, Milagros Reap

## 2013-07-24 ENCOUNTER — Encounter: Payer: Self-pay | Admitting: *Deleted

## 2013-07-30 ENCOUNTER — Ambulatory Visit: Payer: Medicaid Other | Admitting: Internal Medicine

## 2013-08-01 ENCOUNTER — Other Ambulatory Visit: Payer: Self-pay | Admitting: *Deleted

## 2013-08-01 MED ORDER — HYDRALAZINE HCL 25 MG PO TABS: 25.0000 mg | ORAL_TABLET | Freq: Three times a day (TID) | ORAL | Status: DC

## 2013-08-02 ENCOUNTER — Other Ambulatory Visit: Payer: Self-pay | Admitting: *Deleted

## 2013-08-02 MED ORDER — SOTALOL HCL 80 MG PO TABS: 120.0000 mg | ORAL_TABLET | Freq: Two times a day (BID) | ORAL | Status: DC

## 2013-08-06 ENCOUNTER — Ambulatory Visit: Payer: Medicaid Other | Admitting: Internal Medicine

## 2013-08-30 ENCOUNTER — Other Ambulatory Visit: Payer: Self-pay | Admitting: *Deleted

## 2013-08-30 MED ORDER — FUROSEMIDE 40 MG PO TABS: 40.0000 mg | ORAL_TABLET | Freq: Two times a day (BID) | ORAL | Status: DC

## 2013-08-30 MED ORDER — SOTALOL HCL 80 MG PO TABS: 120.0000 mg | ORAL_TABLET | Freq: Two times a day (BID) | ORAL | Status: DC

## 2013-09-06 ENCOUNTER — Encounter: Payer: Self-pay | Admitting: Internal Medicine

## 2013-09-06 ENCOUNTER — Ambulatory Visit (INDEPENDENT_AMBULATORY_CARE_PROVIDER_SITE_OTHER): Payer: Medicaid Other | Admitting: Internal Medicine

## 2013-09-06 VITALS — BP 128/84 | HR 65 | Ht 72.0 in | Wt 301.8 lb

## 2013-09-06 DIAGNOSIS — T82118A Breakdown (mechanical) of other cardiac electronic device, initial encounter: Secondary | ICD-10-CM

## 2013-09-06 DIAGNOSIS — I10 Essential (primary) hypertension: Secondary | ICD-10-CM

## 2013-09-06 DIAGNOSIS — I428 Other cardiomyopathies: Secondary | ICD-10-CM

## 2013-09-06 DIAGNOSIS — I5022 Chronic systolic (congestive) heart failure: Secondary | ICD-10-CM

## 2013-09-06 DIAGNOSIS — I4729 Other ventricular tachycardia: Secondary | ICD-10-CM

## 2013-09-06 DIAGNOSIS — I472 Ventricular tachycardia: Secondary | ICD-10-CM

## 2013-09-06 LAB — MDC_IDC_ENUM_SESS_TYPE_INCLINIC
Brady Statistic RV Percent Paced: 0 %
Date Time Interrogation Session: 20150109171743
HIGH POWER IMPEDANCE MEASURED VALUE: 43.6103
Lead Channel Impedance Value: 312.5 Ohm
Lead Channel Impedance Value: 587.5 Ohm
Lead Channel Pacing Threshold Amplitude: 0.5 V
Lead Channel Pacing Threshold Amplitude: 0.5 V
Lead Channel Pacing Threshold Pulse Width: 0.5 ms
Lead Channel Sensing Intrinsic Amplitude: 5.1 mV
Lead Channel Setting Sensing Sensitivity: 0.4 mV
MDC IDC MSMT BATTERY REMAINING LONGEVITY: 18 mo
MDC IDC MSMT BATTERY VOLTAGE: 2.54 V
MDC IDC MSMT LEADCHNL RA PACING THRESHOLD AMPLITUDE: 1 V
MDC IDC MSMT LEADCHNL RA PACING THRESHOLD AMPLITUDE: 1 V
MDC IDC MSMT LEADCHNL RA PACING THRESHOLD PULSEWIDTH: 0.5 ms
MDC IDC MSMT LEADCHNL RA PACING THRESHOLD PULSEWIDTH: 0.5 ms
MDC IDC MSMT LEADCHNL RA SENSING INTR AMPL: 5 mV
MDC IDC MSMT LEADCHNL RV PACING THRESHOLD PULSEWIDTH: 0.5 ms
MDC IDC PG SERIAL: 456752
MDC IDC SET LEADCHNL RA PACING AMPLITUDE: 2 V
MDC IDC SET LEADCHNL RV PACING AMPLITUDE: 2.5 V
MDC IDC SET LEADCHNL RV PACING PULSEWIDTH: 0.5 ms
MDC IDC STAT BRADY RA PERCENT PACED: 6 %
Zone Setting Detection Interval: 280 ms
Zone Setting Detection Interval: 320 ms

## 2013-09-06 NOTE — Assessment & Plan Note (Signed)
His chronic systolic heart failure remains class II. He'll continue his current medical therapy.

## 2013-09-06 NOTE — Assessment & Plan Note (Signed)
His blood pressure is well controlled. He is encouraged to lose weight and maintain a low-sodium diet.

## 2013-09-06 NOTE — Assessment & Plan Note (Signed)
His St. Jude dual-chamber ICD is working normally. We'll plan to recheck in several months. 

## 2013-09-06 NOTE — Assessment & Plan Note (Signed)
He has had no recurrent ventricular arrhythmias. He'll continue his current medical therapy.
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
## 2013-09-20 ENCOUNTER — Other Ambulatory Visit: Payer: Self-pay

## 2013-09-20 MED ORDER — HYDRALAZINE HCL 25 MG PO TABS: 25.0000 mg | ORAL_TABLET | Freq: Three times a day (TID) | ORAL | Status: DC

## 2013-09-30 ENCOUNTER — Other Ambulatory Visit: Payer: Self-pay

## 2013-09-30 MED ORDER — LISINOPRIL 40 MG PO TABS: 40.0000 mg | ORAL_TABLET | Freq: Every day | ORAL | Status: DC

## 2013-09-30 MED ORDER — SOTALOL HCL 80 MG PO TABS: 120.0000 mg | ORAL_TABLET | Freq: Two times a day (BID) | ORAL | Status: DC

## 2013-10-18 ENCOUNTER — Other Ambulatory Visit: Payer: Self-pay | Admitting: *Deleted

## 2013-10-18 MED ORDER — FUROSEMIDE 40 MG PO TABS: 40.0000 mg | ORAL_TABLET | Freq: Two times a day (BID) | ORAL | Status: DC

## 2013-12-09 ENCOUNTER — Encounter: Payer: Medicaid Other | Admitting: *Deleted

## 2013-12-25 ENCOUNTER — Encounter: Payer: Self-pay | Admitting: *Deleted

## 2014-01-07 ENCOUNTER — Telehealth: Payer: Self-pay | Admitting: *Deleted

## 2014-01-07 DIAGNOSIS — I5022 Chronic systolic (congestive) heart failure: Secondary | ICD-10-CM

## 2014-01-07 MED ORDER — POTASSIUM CHLORIDE CRYS ER 20 MEQ PO TBCR: 40.0000 meq | EXTENDED_RELEASE_TABLET | Freq: Two times a day (BID) | ORAL | Status: DC

## 2014-01-07 MED ORDER — METOPROLOL SUCCINATE ER 200 MG PO TB24: 200.0000 mg | ORAL_TABLET | Freq: Every day | ORAL | Status: DC

## 2014-01-07 MED ORDER — SPIRONOLACTONE 25 MG PO TABS: 25.0000 mg | ORAL_TABLET | Freq: Every day | ORAL | Status: DC

## 2014-01-07 NOTE — Telephone Encounter (Signed)
Patient called for 3 scripts

## 2014-01-23 ENCOUNTER — Telehealth: Payer: Self-pay | Admitting: Internal Medicine

## 2014-01-23 NOTE — Telephone Encounter (Signed)
New message     Pacemaker/defib was vibrating---it did not shock him.  Is this normal?

## 2014-01-23 NOTE — Telephone Encounter (Signed)
LMOM

## 2014-01-24 NOTE — Telephone Encounter (Signed)
Pt scheduled for 01-28-14 with GT.

## 2014-01-28 ENCOUNTER — Ambulatory Visit (INDEPENDENT_AMBULATORY_CARE_PROVIDER_SITE_OTHER): Payer: Medicaid Other | Admitting: Internal Medicine

## 2014-01-28 ENCOUNTER — Encounter: Payer: Self-pay | Admitting: Internal Medicine

## 2014-01-28 VITALS — BP 134/90 | HR 79 | Ht 72.0 in | Wt 303.0 lb

## 2014-01-28 DIAGNOSIS — I5022 Chronic systolic (congestive) heart failure: Secondary | ICD-10-CM

## 2014-01-28 DIAGNOSIS — I428 Other cardiomyopathies: Secondary | ICD-10-CM

## 2014-01-28 DIAGNOSIS — T82110A Breakdown (mechanical) of cardiac electrode, initial encounter: Secondary | ICD-10-CM | POA: Insufficient documentation

## 2014-01-28 DIAGNOSIS — I4729 Other ventricular tachycardia: Secondary | ICD-10-CM

## 2014-01-28 DIAGNOSIS — I472 Ventricular tachycardia: Secondary | ICD-10-CM

## 2014-01-28 LAB — MDC_IDC_ENUM_SESS_TYPE_INCLINIC
Battery Remaining Longevity: 4.9 mo
Battery Voltage: 2.5 V
Brady Statistic RA Percent Paced: 7.4 %
Date Time Interrogation Session: 20150602125918
HighPow Impedance: 38.9821
Implantable Pulse Generator Serial Number: 456752
Lead Channel Impedance Value: 75 Ohm
Lead Channel Pacing Threshold Amplitude: 1 V
Lead Channel Pacing Threshold Amplitude: 1 V
Lead Channel Pacing Threshold Pulse Width: 0.5 ms
Lead Channel Pacing Threshold Pulse Width: 0.8 ms
Lead Channel Sensing Intrinsic Amplitude: 1 mV
Lead Channel Sensing Intrinsic Amplitude: 5.3 mV
Lead Channel Setting Pacing Amplitude: 3 V
Lead Channel Setting Pacing Pulse Width: 0.8 ms
Lead Channel Setting Sensing Sensitivity: 0.4 mV
MDC IDC MSMT LEADCHNL RA PACING THRESHOLD PULSEWIDTH: 0.5 ms
MDC IDC MSMT LEADCHNL RV IMPEDANCE VALUE: 287.5 Ohm
MDC IDC MSMT LEADCHNL RV PACING THRESHOLD AMPLITUDE: 1.5 V
MDC IDC SET ZONE DETECTION INTERVAL: 280 ms
MDC IDC STAT BRADY RV PERCENT PACED: 0.03 %
Zone Setting Detection Interval: 320 ms

## 2014-01-28 NOTE — Patient Instructions (Signed)
Your physician wants you to follow-up in: 12 months with Dr Court Joy will receive a reminder letter in the mail two months in advance. If you don't receive a letter, please call our office to schedule the follow-up appointment.   Remote monitoring is used to monitor your Pacemaker or ICD from home. This monitoring reduces the number of office visits required to check your device to one time per year. It allows [place] to keep an eye on the functioning of your device to ensure it is working properly. You are scheduled for a device check from home on 05/01/14. You may send your transmission at any time that day. If you have a wireless device, the transmission will be sent automatically. After your physician reviews your transmission, you will receive a postcard with your next transmission date.  Will call you in regards to pulmonary appoinment

## 2014-01-28 NOTE — Assessment & Plan Note (Signed)
He has had no recurrent VT. Will follow.  

## 2014-01-28 NOTE — Assessment & Plan Note (Signed)
His atrial lead is malfunctioned. He will need to have his lead removed with insertion of a new atrial lead. He is approx. 4 months from ERI. Will follow.
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
## 2014-04-28 ENCOUNTER — Telehealth: Payer: Self-pay | Admitting: Internal Medicine

## 2014-04-28 NOTE — Telephone Encounter (Signed)
ERI reached. Patient aware. Will defer to Glynda Jaeger for scheduling.

## 2014-04-28 NOTE — Telephone Encounter (Signed)
Asked pt to send Merlin. Pt voiced understanding.

## 2014-04-28 NOTE — Telephone Encounter (Signed)
New message      Pt states his pacemaker box vibrated twice.  What does this mean?

## 2014-05-01 ENCOUNTER — Ambulatory Visit (INDEPENDENT_AMBULATORY_CARE_PROVIDER_SITE_OTHER): Payer: Medicaid Other | Admitting: *Deleted

## 2014-05-01 DIAGNOSIS — I428 Other cardiomyopathies: Secondary | ICD-10-CM

## 2014-05-01 NOTE — Progress Notes (Signed)
Remote ICD transmission.   

## 2014-05-09 ENCOUNTER — Encounter: Payer: Self-pay | Admitting: Internal Medicine

## 2014-05-09 ENCOUNTER — Ambulatory Visit (INDEPENDENT_AMBULATORY_CARE_PROVIDER_SITE_OTHER): Payer: Medicaid Other | Admitting: Internal Medicine

## 2014-05-09 VITALS — BP 126/82 | HR 75 | Ht 71.0 in | Wt 299.4 lb

## 2014-05-09 DIAGNOSIS — I428 Other cardiomyopathies: Secondary | ICD-10-CM

## 2014-05-09 DIAGNOSIS — I4729 Other ventricular tachycardia: Secondary | ICD-10-CM

## 2014-05-09 DIAGNOSIS — T82118D Breakdown (mechanical) of other cardiac electronic device, subsequent encounter: Secondary | ICD-10-CM

## 2014-05-09 DIAGNOSIS — I5022 Chronic systolic (congestive) heart failure: Secondary | ICD-10-CM

## 2014-05-09 DIAGNOSIS — Z5189 Encounter for other specified aftercare: Secondary | ICD-10-CM

## 2014-05-09 DIAGNOSIS — T82190A Other mechanical complication of cardiac electrode, initial encounter: Secondary | ICD-10-CM

## 2014-05-09 DIAGNOSIS — I472 Ventricular tachycardia: Secondary | ICD-10-CM

## 2014-05-09 DIAGNOSIS — T82110D Breakdown (mechanical) of cardiac electrode, subsequent encounter: Secondary | ICD-10-CM

## 2014-05-09 LAB — CBC WITH DIFFERENTIAL/PLATELET
BASOS ABS: 0 10*3/uL (ref 0.0–0.1)
BASOS PCT: 0.5 % (ref 0.0–3.0)
Eosinophils Absolute: 0.2 10*3/uL (ref 0.0–0.7)
Eosinophils Relative: 3.5 % (ref 0.0–5.0)
HCT: 41.6 % (ref 39.0–52.0)
HEMOGLOBIN: 13.8 g/dL (ref 13.0–17.0)
Lymphocytes Relative: 36.9 % (ref 12.0–46.0)
Lymphs Abs: 1.8 10*3/uL (ref 0.7–4.0)
MCHC: 33 g/dL (ref 30.0–36.0)
MCV: 88.3 fl (ref 78.0–100.0)
MONOS PCT: 9.4 % (ref 3.0–12.0)
Monocytes Absolute: 0.5 10*3/uL (ref 0.1–1.0)
Neutro Abs: 2.5 10*3/uL (ref 1.4–7.7)
Neutrophils Relative %: 49.7 % (ref 43.0–77.0)
Platelets: 217 10*3/uL (ref 150.0–400.0)
RBC: 4.71 Mil/uL (ref 4.22–5.81)
RDW: 14.3 % (ref 11.5–15.5)
WBC: 5 10*3/uL (ref 4.0–10.5)

## 2014-05-09 LAB — BASIC METABOLIC PANEL
BUN: 15 mg/dL (ref 6–23)
CHLORIDE: 104 meq/L (ref 96–112)
CO2: 29 meq/L (ref 19–32)
CREATININE: 1 mg/dL (ref 0.4–1.5)
Calcium: 9.2 mg/dL (ref 8.4–10.5)
GFR: 99.15 mL/min (ref >60.00)
Glucose, Bld: 110 mg/dL — ABNORMAL HIGH (ref 70–99)
Potassium: 3.8 mEq/L (ref 3.5–5.1)
Sodium: 140 mEq/L (ref 135–145)

## 2014-05-09 NOTE — Assessment & Plan Note (Signed)
He has had no recurrent ICD therapies since his last visit. He will continue sotalol.

## 2014-05-09 NOTE — Assessment & Plan Note (Signed)
His device has reached ERI. Will schedule removal of his old device and insertion of a new device. He will require lead revision. We discussed possible extraction and he wishes not to proceed with extraction.

## 2014-05-09 NOTE — Patient Instructions (Addendum)
Your physician recommends that you have lab work today (BMET, CBC).  Your physician recommends that you schedule a follow-up appointment in about 10 days after your procedure (around June 05, 2014) in device clinic to check your site.   Generator scheduled- see instruction sheet.

## 2014-05-12 ENCOUNTER — Other Ambulatory Visit: Payer: Self-pay | Admitting: *Deleted

## 2014-05-12 LAB — MDC_IDC_ENUM_SESS_TYPE_REMOTE
Battery Remaining Longevity: 0 mo
Battery Voltage: 2.44 V
Brady Statistic RV Percent Paced: 1 %
Date Time Interrogation Session: 20150831192825
HIGH POWER IMPEDANCE MEASURED VALUE: 44 Ohm
Lead Channel Impedance Value: 340 Ohm
Lead Channel Impedance Value: 500 Ohm
Lead Channel Pacing Threshold Amplitude: 1.5 V
Lead Channel Sensing Intrinsic Amplitude: 5 mV
Lead Channel Sensing Intrinsic Amplitude: 7.7 mV
Lead Channel Setting Pacing Amplitude: 3 V
MDC IDC MSMT LEADCHNL RV PACING THRESHOLD PULSEWIDTH: 0.8 ms
MDC IDC PG SERIAL: 456752
MDC IDC SET LEADCHNL RV PACING PULSEWIDTH: 0.8 ms
MDC IDC SET LEADCHNL RV SENSING SENSITIVITY: 0.4 mV
MDC IDC SET ZONE DETECTION INTERVAL: 280 ms
MDC IDC SET ZONE DETECTION INTERVAL: 320 ms

## 2014-05-12 MED ORDER — SOTALOL HCL 80 MG PO TABS: 120.0000 mg | ORAL_TABLET | Freq: Two times a day (BID) | ORAL | Status: DC

## 2014-05-12 MED ORDER — FUROSEMIDE 40 MG PO TABS: 40.0000 mg | ORAL_TABLET | Freq: Two times a day (BID) | ORAL | Status: DC

## 2014-05-14 NOTE — Assessment & Plan Note (Signed)
We will plan for lead revision.
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
## 2014-05-14 NOTE — Assessment & Plan Note (Signed)
His symptoms are well controlled. He will continue his current meds. 

## 2014-05-15 ENCOUNTER — Other Ambulatory Visit: Payer: Self-pay | Admitting: *Deleted

## 2014-05-15 MED ORDER — METOPROLOL SUCCINATE ER 200 MG PO TB24: 200.0000 mg | ORAL_TABLET | Freq: Every day | ORAL | Status: DC

## 2014-05-15 MED ORDER — LISINOPRIL 40 MG PO TABS: 40.0000 mg | ORAL_TABLET | Freq: Every day | ORAL | Status: DC

## 2014-05-20 ENCOUNTER — Encounter: Payer: Self-pay | Admitting: Internal Medicine

## 2014-05-23 ENCOUNTER — Other Ambulatory Visit: Payer: Self-pay

## 2014-05-23 ENCOUNTER — Encounter (HOSPITAL_COMMUNITY): Payer: Self-pay | Admitting: Pharmacy Technician

## 2014-05-23 MED ORDER — SPIRONOLACTONE 25 MG PO TABS: 25.0000 mg | ORAL_TABLET | Freq: Every day | ORAL | Status: DC

## 2014-05-25 DIAGNOSIS — Z4502 Encounter for adjustment and management of automatic implantable cardiac defibrillator: Secondary | ICD-10-CM | POA: Diagnosis present

## 2014-05-25 DIAGNOSIS — I10 Essential (primary) hypertension: Secondary | ICD-10-CM | POA: Diagnosis not present

## 2014-05-25 DIAGNOSIS — I5022 Chronic systolic (congestive) heart failure: Secondary | ICD-10-CM | POA: Diagnosis not present

## 2014-05-25 DIAGNOSIS — I4729 Other ventricular tachycardia: Secondary | ICD-10-CM | POA: Diagnosis not present

## 2014-05-25 DIAGNOSIS — Y831 Surgical operation with implant of artificial internal device as the cause of abnormal reaction of the patient, or of later complication, without mention of misadventure at the time of the procedure: Secondary | ICD-10-CM | POA: Diagnosis not present

## 2014-05-25 DIAGNOSIS — Z79899 Other long term (current) drug therapy: Secondary | ICD-10-CM | POA: Diagnosis not present

## 2014-05-25 DIAGNOSIS — I428 Other cardiomyopathies: Secondary | ICD-10-CM | POA: Diagnosis not present

## 2014-05-25 DIAGNOSIS — I4891 Unspecified atrial fibrillation: Secondary | ICD-10-CM | POA: Diagnosis not present

## 2014-05-25 DIAGNOSIS — G4733 Obstructive sleep apnea (adult) (pediatric): Secondary | ICD-10-CM | POA: Diagnosis not present

## 2014-05-25 DIAGNOSIS — I472 Ventricular tachycardia: Secondary | ICD-10-CM | POA: Diagnosis not present

## 2014-05-25 DIAGNOSIS — I509 Heart failure, unspecified: Secondary | ICD-10-CM | POA: Diagnosis not present

## 2014-05-25 DIAGNOSIS — T82198A Other mechanical complication of other cardiac electronic device, initial encounter: Secondary | ICD-10-CM | POA: Diagnosis not present

## 2014-05-25 DIAGNOSIS — Z6841 Body Mass Index (BMI) 40.0 and over, adult: Secondary | ICD-10-CM | POA: Diagnosis not present

## 2014-05-25 DIAGNOSIS — I495 Sick sinus syndrome: Secondary | ICD-10-CM | POA: Diagnosis not present

## 2014-05-25 MED ORDER — DEXTROSE 5 % IV SOLN
3.0000 g | INTRAVENOUS | Status: DC
  Filled 2014-05-25: qty 3000

## 2014-05-25 MED ORDER — SODIUM CHLORIDE 0.9 % IR SOLN
80.0000 mg | Status: DC
  Filled 2014-05-25: qty 2

## 2014-05-26 ENCOUNTER — Ambulatory Visit (HOSPITAL_COMMUNITY)
Admission: RE | Admit: 2014-05-26 | Discharge: 2014-05-27 | Disposition: A | Discharge status: Home / Self Care | Payer: Medicaid Other | Source: Ambulatory Visit | Attending: Internal Medicine | Admitting: Internal Medicine

## 2014-05-26 ENCOUNTER — Encounter (HOSPITAL_COMMUNITY): Payer: Self-pay | Admitting: General Practice

## 2014-05-26 ENCOUNTER — Encounter (HOSPITAL_COMMUNITY)
Admission: RE | Disposition: A | Discharge status: Home / Self Care | Payer: Self-pay | Source: Ambulatory Visit | Attending: Internal Medicine

## 2014-05-26 DIAGNOSIS — T82198A Other mechanical complication of other cardiac electronic device, initial encounter: Secondary | ICD-10-CM

## 2014-05-26 DIAGNOSIS — Y831 Surgical operation with implant of artificial internal device as the cause of abnormal reaction of the patient, or of later complication, without mention of misadventure at the time of the procedure: Secondary | ICD-10-CM | POA: Insufficient documentation

## 2014-05-26 DIAGNOSIS — I509 Heart failure, unspecified: Secondary | ICD-10-CM | POA: Diagnosis not present

## 2014-05-26 DIAGNOSIS — I428 Other cardiomyopathies: Secondary | ICD-10-CM

## 2014-05-26 DIAGNOSIS — T82118D Breakdown (mechanical) of other cardiac electronic device, subsequent encounter: Secondary | ICD-10-CM

## 2014-05-26 DIAGNOSIS — Z6841 Body Mass Index (BMI) 40.0 and over, adult: Secondary | ICD-10-CM | POA: Insufficient documentation

## 2014-05-26 DIAGNOSIS — Z79899 Other long term (current) drug therapy: Secondary | ICD-10-CM | POA: Insufficient documentation

## 2014-05-26 DIAGNOSIS — I5022 Chronic systolic (congestive) heart failure: Secondary | ICD-10-CM | POA: Diagnosis not present

## 2014-05-26 DIAGNOSIS — I4729 Other ventricular tachycardia: Secondary | ICD-10-CM | POA: Insufficient documentation

## 2014-05-26 DIAGNOSIS — Z4502 Encounter for adjustment and management of automatic implantable cardiac defibrillator: Secondary | ICD-10-CM | POA: Diagnosis not present

## 2014-05-26 DIAGNOSIS — I472 Ventricular tachycardia, unspecified: Secondary | ICD-10-CM

## 2014-05-26 DIAGNOSIS — I10 Essential (primary) hypertension: Secondary | ICD-10-CM

## 2014-05-26 DIAGNOSIS — G4733 Obstructive sleep apnea (adult) (pediatric): Secondary | ICD-10-CM | POA: Insufficient documentation

## 2014-05-26 DIAGNOSIS — I495 Sick sinus syndrome: Secondary | ICD-10-CM | POA: Insufficient documentation

## 2014-05-26 DIAGNOSIS — I4891 Unspecified atrial fibrillation: Secondary | ICD-10-CM | POA: Insufficient documentation

## 2014-05-26 HISTORY — DX: Gastro-esophageal reflux disease without esophagitis: K21.9

## 2014-05-26 HISTORY — PX: LEAD REVISION: SHX5945

## 2014-05-26 HISTORY — PX: IMPLANTABLE CARDIOVERTER DEFIBRILLATOR GENERATOR CHANGE: SHX5474

## 2014-05-26 HISTORY — PX: ICD GENERATOR CHANGE: SHX5854

## 2014-05-26 LAB — BASIC METABOLIC PANEL
ANION GAP: 11 (ref 5–15)
BUN: 10 mg/dL (ref 6–23)
CO2: 27 meq/L (ref 19–32)
CREATININE: 0.92 mg/dL (ref 0.50–1.35)
Calcium: 9.4 mg/dL (ref 8.4–10.5)
Chloride: 105 mEq/L (ref 96–112)
GFR calc Af Amer: >90 mL/min (ref >90)
GFR calc non Af Amer: >90 mL/min (ref >90)
Glucose, Bld: 99 mg/dL (ref 70–99)
Potassium: 4 mEq/L (ref 3.7–5.3)
Sodium: 143 mEq/L (ref 137–147)

## 2014-05-26 LAB — CBC
HEMATOCRIT: 40.4 % (ref 39.0–52.0)
Hemoglobin: 13.4 g/dL (ref 13.0–17.0)
MCH: 29.1 pg (ref 26.0–34.0)
MCHC: 33.2 g/dL (ref 30.0–36.0)
MCV: 87.8 fL (ref 78.0–100.0)
Platelets: 217 10*3/uL (ref 150–400)
RBC: 4.6 MIL/uL (ref 4.22–5.81)
RDW: 13.1 % (ref 11.5–15.5)
WBC: 3.6 10*3/uL — ABNORMAL LOW (ref 4.0–10.5)

## 2014-05-26 LAB — SURGICAL PCR SCREEN
MRSA, PCR: NEGATIVE
Staphylococcus aureus: NEGATIVE

## 2014-05-26 SURGERY — IMPLANTABLE CARDIOVERTER DEFIBRILLATOR GENERATOR CHANGE
Anesthesia: LOCAL

## 2014-05-26 MED ORDER — LIDOCAINE HCL (PF) 1 % IJ SOLN
INTRAMUSCULAR | Status: AC
  Filled 2014-05-26: qty 30

## 2014-05-26 MED ORDER — SOTALOL HCL 120 MG PO TABS
120.0000 mg | ORAL_TABLET | Freq: Two times a day (BID) | ORAL | Status: DC
  Administered 2014-05-26 – 2014-05-27 (×2): 120 mg via ORAL
  Filled 2014-05-26 (×4): qty 1

## 2014-05-26 MED ORDER — MUPIROCIN 2 % EX OINT
TOPICAL_OINTMENT | CUTANEOUS | Status: AC
  Administered 2014-05-26: 12:00:00
  Filled 2014-05-26: qty 22

## 2014-05-26 MED ORDER — CEFAZOLIN SODIUM-DEXTROSE 2-3 GM-% IV SOLR
2.0000 g | Freq: Four times a day (QID) | INTRAVENOUS | Status: AC
  Administered 2014-05-26 – 2014-05-27 (×3): 2 g via INTRAVENOUS
  Filled 2014-05-26 (×3): qty 50

## 2014-05-26 MED ORDER — MIDAZOLAM HCL 5 MG/5ML IJ SOLN
INTRAMUSCULAR | Status: AC
  Filled 2014-05-26: qty 5

## 2014-05-26 MED ORDER — METOPROLOL SUCCINATE ER 25 MG PO TB24
25.0000 mg | ORAL_TABLET | Freq: Every day | ORAL | Status: DC
  Administered 2014-05-26 – 2014-05-27 (×2): 25 mg via ORAL
  Filled 2014-05-26 (×2): qty 1

## 2014-05-26 MED ORDER — HEPARIN (PORCINE) IN NACL 2-0.9 UNIT/ML-% IJ SOLN
INTRAMUSCULAR | Status: AC
  Filled 2014-05-26: qty 500

## 2014-05-26 MED ORDER — CHLORHEXIDINE GLUCONATE 4 % EX LIQD
60.0000 mL | Freq: Once | CUTANEOUS | Status: DC
  Filled 2014-05-26: qty 60

## 2014-05-26 MED ORDER — MUPIROCIN 2 % EX OINT: 1.0000 "application " | TOPICAL_OINTMENT | Freq: Once | CUTANEOUS | Status: DC

## 2014-05-26 MED ORDER — ONDANSETRON HCL 4 MG/2ML IJ SOLN: 4.0000 mg | Freq: Four times a day (QID) | INTRAMUSCULAR | Status: DC | PRN

## 2014-05-26 MED ORDER — HYDRALAZINE HCL 25 MG PO TABS
25.0000 mg | ORAL_TABLET | Freq: Three times a day (TID) | ORAL | Status: DC
  Administered 2014-05-26 – 2014-05-27 (×3): 25 mg via ORAL
  Filled 2014-05-26 (×5): qty 1

## 2014-05-26 MED ORDER — LISINOPRIL 40 MG PO TABS
40.0000 mg | ORAL_TABLET | Freq: Every day | ORAL | Status: DC
  Administered 2014-05-26 – 2014-05-27 (×2): 40 mg via ORAL
  Filled 2014-05-26 (×2): qty 1

## 2014-05-26 MED ORDER — SPIRONOLACTONE 25 MG PO TABS
25.0000 mg | ORAL_TABLET | Freq: Every day | ORAL | Status: DC
  Administered 2014-05-26 – 2014-05-27 (×2): 25 mg via ORAL
  Filled 2014-05-26 (×2): qty 1

## 2014-05-26 MED ORDER — FUROSEMIDE 40 MG PO TABS
40.0000 mg | ORAL_TABLET | Freq: Two times a day (BID) | ORAL | Status: DC
  Administered 2014-05-27: 09:00:00 40 mg via ORAL
  Filled 2014-05-26 (×4): qty 1

## 2014-05-26 MED ORDER — FENTANYL CITRATE 0.05 MG/ML IJ SOLN
INTRAMUSCULAR | Status: AC
  Filled 2014-05-26: qty 2

## 2014-05-26 MED ORDER — SODIUM CHLORIDE 0.9 % IV SOLN
INTRAVENOUS | Status: DC
  Administered 2014-05-26: 12:00:00 via INTRAVENOUS

## 2014-05-26 MED ORDER — ACETAMINOPHEN 325 MG PO TABS: 325.0000 mg | ORAL_TABLET | ORAL | Status: DC | PRN

## 2014-05-26 MED ORDER — POTASSIUM CHLORIDE CRYS ER 20 MEQ PO TBCR
20.0000 meq | EXTENDED_RELEASE_TABLET | Freq: Two times a day (BID) | ORAL | Status: DC
  Administered 2014-05-26 – 2014-05-27 (×3): 20 meq via ORAL
  Filled 2014-05-26 (×4): qty 1

## 2014-05-26 NOTE — CV Procedure (Signed)
Electrophysiology procedure note  Procedure: Insertion of a new atrial pacing lead, removal of a previous implanted ICD, and insertion of a new ICD utilizing left upper extremity venography.  Preoperative diagnosis: Ventricular tachycardia in the setting of a nonischemic cardiomyopathy, chronic class II systolic heart failure, status post ICD, with the device at elective replacement, and documented atrial lead dysfunction.  Postoperative diagnoses: Same as preoperative diagnoses   Description of the procedure: After informed consent was obtained, the patient was taken to the diagnostic EP lab in the fasting state. After the usual preparation and draping, intravenous Versed and fentanyl were used for sedation. 30 cc of lidocaine was infiltrated into the left infraclavicular region. A 6 cm incision was carried out. Electrocautery was utilized to dissect down to the fascial plane. Initial attempts to cannulate the left subclavian vein were unsuccessful. Venography of the left upper extremity was carried out. This demonstrated a patent left subclavian vein which was displaced superiorly. The vein was then punctured and the Medtronic model 5076 active-fixation 52 cm pacing lead, serial number [id number], was inserted into the right atrium and mapping carried out. At the final site, near the right atrial appendage, the P waves measured 3 mV. With the lead actively fixed, the pacing impedance was 500 ohms and the threshold was 1.6 V at 0.5 ms. 10 V pacing did not stimulate the diaphragm. With these satisfactory parameters, the lead was secured to the fascia with silk suture. The sewing sleeve was secured with silk suture.  Electrocautery was utilized to free up the dense fibrous adhesions around the ICD. The ICD generator was removed with gentle traction. The old atrial lead which was found to be dysfunctional secondary to noise and a reduced pacing impedance, was. The new St. Jude dual-chamber ICD, serial number  [id number], was attached to the new atrial lead and the old ICD lead and placed in the subcutaneous pocket. The pocket was irrigated with antibiotic irrigation. The incision was closed with 2 layers of Vicryl suture.   At this point, I scrubbed out of the case to supervise defibrillation threshold testing. After the patient was more deeply sedated with additional Versed and fentanyl under my direct supervision, ventricular fibrillation was induced with a T-wave shock. A 20 J shock was delivered, terminating ventricular fibrillation, and restoring sinus rhythm. Benzoin and steri-strips were painted on the skin. The patient was returned to his room in stable condition.  Complications: none immediately  Conclusion: successful removal of an old ICD which had reached elective replacement and insertion of a new atrial lead and new ICD generator without immediate complication. DFT less than or equal to 20 Joules.   Leonia Reeves.D.
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
## 2014-05-26 NOTE — Interval H&P Note (Signed)
History and Physical Interval Note:  05/26/2014 11:24 AM  Kyle Vincent  has presented today for surgery, with the diagnosis of battery depletion, lead failure  The various methods of treatment have been discussed with the patient and family. After consideration of risks, benefits and other options for treatment, the patient has consented to  Procedure(s): IMPLANTABLE CARDIOVERTER DEFIBRILLATOR GENERATOR CHANGE (N/A) LEAD REVISION (N/A) as a surgical intervention .  The patient's history has been reviewed, patient examined, no change in status, stable for surgery.  I have reviewed the patient's chart and labs.  Questions were answered to the patient's satisfaction.     Leonia Reeves.D.

## 2014-05-27 ENCOUNTER — Ambulatory Visit (HOSPITAL_COMMUNITY): Payer: Medicaid Other

## 2014-05-27 DIAGNOSIS — I509 Heart failure, unspecified: Secondary | ICD-10-CM | POA: Diagnosis not present

## 2014-05-27 DIAGNOSIS — Z4502 Encounter for adjustment and management of automatic implantable cardiac defibrillator: Secondary | ICD-10-CM | POA: Diagnosis not present

## 2014-05-27 DIAGNOSIS — I5022 Chronic systolic (congestive) heart failure: Secondary | ICD-10-CM | POA: Diagnosis not present

## 2014-05-27 DIAGNOSIS — I428 Other cardiomyopathies: Secondary | ICD-10-CM | POA: Diagnosis not present

## 2014-05-27 MED ORDER — OXYCODONE-ACETAMINOPHEN 7.5-325 MG PO TABS: 1.0000 | ORAL_TABLET | ORAL | Status: DC | PRN

## 2014-05-27 NOTE — Discharge Instructions (Signed)
° ° °  Supplemental Discharge Instructions for  Pacemaker/Defibrillator Patients  Activity No heavy lifting or vigorous activity with your left/right arm for 6 to 8 weeks.  Do not raise your left/right arm above your head for one week.  Gradually raise your affected arm as drawn below.           __  NO DRIVING for     ; you may begin driving on     .  WOUND CARE   Keep the wound area clean and dry.  Do not get this area wet for one week. No showers for one week; you may shower on     .   The tape/steri-strips on your wound will fall off; do not pull them off.  No bandage is needed on the site.  DO  NOT apply any creams, oils, or ointments to the wound area.   If you notice any drainage or discharge from the wound, any swelling or bruising at the site, or you develop a fever > 101? F after you are discharged home, call the office at once.  Special Instructions   You are still able to use cellular telephones; use the ear opposite the side where you have your pacemaker/defibrillator.  Avoid carrying your cellular phone near your device.   When traveling through airports, show security personnel your identification card to avoid being screened in the metal detectors.  Ask the security personnel to use the hand wand.   Avoid arc welding equipment, MRI testing (magnetic resonance imaging), TENS units (transcutaneous nerve stimulators).  Call the office for questions about other devices.   Avoid electrical appliances that are in poor condition or are not properly grounded.   Microwave ovens are safe to be near or to operate.  Additional information for defibrillator patients should your device go off:   If your device goes off ONCE and you feel fine afterward, notify the device clinic nurses.   If your device goes off ONCE and you do not feel well afterward, call 911.   If your device goes off TWICE, call 911.   If your device goes off THREE times in one day, call 911.  DO NOT DRIVE YOURSELF OR  A FAMILY MEMBER WITH A DEFIBRILLATOR TO THE HOSPITAL--CALL 9

## 2014-05-27 NOTE — Discharge Summary (Signed)
ELECTROPHYSIOLOGY PROCEDURE DISCHARGE SUMMARY    Patient ID: Kyle HartiganJoe Carlisi,  MRN: [id number], DOB/AGE: 45-Jan-1972 45 y.o.  Admit date: 05/26/2014 Discharge date: 05/27/2014  Primary Care Physician: No PCP Per Patient Electrophysiologist: Ladona Ridgelaylor  Primary Discharge Diagnosis:  Non ischemic cardiomyopathy, ventricular tachycardia and symptomatic bradycardia status post atrial lead revision and ICD generator change this admission  Secondary Discharge Diagnosis:  1.  Atrial fibrillation 2.  Hypertension 3.  Sleep apnea 4.  Obesity 5. Sinus node dysfunction   No Known Allergies   Procedures This Admission:  1.  ICD generator change and right atrial lead revision on 05-26-2014 by Dr Ladona Ridgelaylor.  The patient received a STJ ICD with model number 5076 right atrial lead.  The patient's previously implanted RV lead was used.  The previously implanted RA lead was abandoned.  DFT's were successful at 20 J.  There were no immediate post procedure complications. 2.  CXR on 05-27-2014 demonstrated no pneumothorax status post device implantation.   Brief HPI: Kyle Vincent is a 43 y.o. male with a past medical history as outlined above.  He underwent original device implantation in 2008 for secondary prevention.  He has had appropriate therapy since device implant and has been maintained on Sotalol.  His ICD recently reached ERI and his atrial lead was found to have noise and an isolated low impedence.  Risks, benefits, and alternatives to ICD generator change and atrial lead revision were reviewed with the patient who wished to proceed. The patient was offered extraction of his current atrial lead but declined.  Hospital Course:  The patient was admitted and underwent ICD generator change with right atrial lead revision with details as outlined above.   He was monitored on telemetry overnight which demonstrated sinus rhythm with occasional atrial pacing.  Left chest was without hematoma or ecchymosis.  The  device was interrogated and found to be functioning normally.  CXR was obtained and demonstrated no pneumothorax status post device implantation.  Wound care, arm mobility, and restrictions were reviewed with the patient.  Dr Ladona Ridgelaylor examined the patient and considered them stable for discharge to home.   The patient's discharge medications include an ACE-I (Lisinopril) and beta blocker (Metoprolol).   Discharge Vitals: Blood pressure 101/67, pulse 63, temperature 98.2 F (36.8 C), temperature source Oral, resp. rate 18, height 5\' 11"  (1.803 m), weight 301 lb 9.4 oz (136.8 kg), SpO2 98.00%.  Labs:   Lab Results  Component Value Date   WBC 3.6* 05/26/2014   HGB 13.4 05/26/2014   HCT 40.4 05/26/2014   MCV 87.8 05/26/2014   PLT 217 05/26/2014    Recent Labs Lab 05/26/14 1104  NA 143  K 4.0  CL 105  CO2 27  BUN 10  CREATININE 0.92  CALCIUM 9.4  GLUCOSE 99     Discharge Medications:    Medication List    ASK your doctor about these medications       furosemide 40 MG tablet  Commonly known as:  LASIX  Take 1 tablet (40 mg total) by mouth 2 (two) times daily.     hydrALAZINE 25 MG tablet  Commonly known as:  APRESOLINE  Take 1 tablet (25 mg total) by mouth 3 (three) times daily.     lisinopril 40 MG tablet  Commonly known as:  PRINIVIL,ZESTRIL  Take 1 tablet (40 mg total) by mouth daily.     magnesium oxide 400 MG tablet  Commonly known as:  MAG-OX  Take 400 mg by  mouth daily.     metoprolol 200 MG 24 hr tablet  Commonly known as:  TOPROL-XL  Take 1 tablet (200 mg total) by mouth daily.     potassium chloride SA 20 MEQ tablet  Commonly known as:  K-DUR,KLOR-CON  Take 20 mEq by mouth 2 (two) times daily.     sotalol 80 MG tablet  Commonly known as:  BETAPACE  Take 1.5 tablets (120 mg total) by mouth 2 (two) times daily.     spironolactone 25 MG tablet  Commonly known as:  ALDACTONE  Take 1 tablet (25 mg total) by mouth daily.        Disposition:      Duration of Discharge Encounter: less than 30 minutes including physician time.  Signed,  Leonia Reeves.D.

## 2014-05-31 ENCOUNTER — Encounter (HOSPITAL_COMMUNITY): Payer: Self-pay | Admitting: *Deleted

## 2014-06-02 NOTE — H&P (Signed)
  ICD Criteria  Current LVEF:25% ;Obtained > 6 months ago.   NYHA Functional Classification: Class II  Heart Failure History:  Yes, Duration of heart failure since onset is > 9 months  Non-Ischemic Dilated Cardiomyopathy History:  Yes, timeframe is > 9 months  Atrial Fibrillation/Atrial Flutter:  No.  Ventricular Tachycardia History:  Yes, Hemodynamic instability present, VT Type:  SVT - Monomorphic.  Cardiac Arrest History:  No  History of Syndromes with Risk of Sudden Death:  No.  Previous ICD:  Yes, ICD Type:  Dual, Reason for ICD:  Secondary, reason for secondary prevention:  Ventricular Tachycardia  Electrophysiology Study: No.  Prior MI: No.  PPM: No.  OSA:  No  Patient Life Expectancy of >=1 year: Yes.  Anticoagulation Therapy:  Patient is NOT on anticoagulation therapy.   Beta Blocker Therapy:  Yes.   Ace Inhibitor/ARB Therapy:  Yes.

## 2014-06-09 ENCOUNTER — Ambulatory Visit (INDEPENDENT_AMBULATORY_CARE_PROVIDER_SITE_OTHER): Payer: Medicaid Other | Admitting: *Deleted

## 2014-06-09 DIAGNOSIS — I472 Ventricular tachycardia: Secondary | ICD-10-CM

## 2014-06-09 DIAGNOSIS — I4729 Other ventricular tachycardia: Secondary | ICD-10-CM

## 2014-06-09 LAB — MDC_IDC_ENUM_SESS_TYPE_INCLINIC
Brady Statistic RV Percent Paced: 0.01 %
HighPow Impedance: 43.6023
HighPow Impedance: 44 Ohm
Implantable Pulse Generator Serial Number: 7224955
Lead Channel Pacing Threshold Amplitude: 0.75 V
Lead Channel Pacing Threshold Amplitude: 0.75 V
Lead Channel Pacing Threshold Pulse Width: 0.5 ms
Lead Channel Pacing Threshold Pulse Width: 0.7 ms
Lead Channel Pacing Threshold Pulse Width: 0.7 ms
Lead Channel Sensing Intrinsic Amplitude: 5 mV
Lead Channel Setting Pacing Amplitude: 2.5 V
Lead Channel Setting Pacing Pulse Width: 0.7 ms
Lead Channel Setting Sensing Sensitivity: 0.5 mV
MDC IDC MSMT BATTERY REMAINING LONGEVITY: 103.2 mo
MDC IDC MSMT LEADCHNL RA IMPEDANCE VALUE: 462.5 Ohm
MDC IDC MSMT LEADCHNL RA PACING THRESHOLD PULSEWIDTH: 0.5 ms
MDC IDC MSMT LEADCHNL RV IMPEDANCE VALUE: 325 Ohm
MDC IDC MSMT LEADCHNL RV PACING THRESHOLD AMPLITUDE: 1.25 V
MDC IDC MSMT LEADCHNL RV PACING THRESHOLD AMPLITUDE: 1.25 V
MDC IDC MSMT LEADCHNL RV SENSING INTR AMPL: 6.3 mV
MDC IDC SESS DTM: 20151012123759
MDC IDC SET LEADCHNL RA PACING AMPLITUDE: 3.5 V
MDC IDC SET ZONE DETECTION INTERVAL: 280 ms
MDC IDC SET ZONE DETECTION INTERVAL: 350 ms
MDC IDC STAT BRADY RA PERCENT PACED: 0.42 %

## 2014-06-09 NOTE — Progress Notes (Signed)
Wound check appointment. Steri-strips removed. Wound without redness or edema. Incision edges approximated, wound well healed. Normal device function. Thresholds, sensing, and impedances consistent with implant measurements. Device programmed at 3.5V for extra safety margin until 3 month visit. Histogram distribution appropriate for patient and level of activity. 1 mode switch shows SVT.  No ventricular arrhythmias noted. Patient educated about wound care, arm mobility, lifting restrictions, shock plan. ROV in 3 months with implanting physician.

## 2014-06-13 ENCOUNTER — Encounter: Payer: Self-pay | Admitting: Internal Medicine

## 2014-06-14 ENCOUNTER — Encounter: Payer: Self-pay | Admitting: Internal Medicine

## 2014-06-16 ENCOUNTER — Telehealth: Payer: Self-pay | Admitting: *Deleted

## 2014-06-16 NOTE — Telephone Encounter (Signed)
Attempted to call pt about ATPx1 on 06/14/14. Pt's phone does not have voicemail set up.

## 2014-06-19 NOTE — Telephone Encounter (Signed)
Pt felt pre-syncopal during episode on 10/16 at 6:53pm.  Pt received ATPx1, returned to sinus rhythm. Pt went to room where he keeps his Merlin and saw it processing to send a remote.   Pt aware no driving N1AF. ROV w/ Dr. [name] 09/09/14.

## 2014-07-09 ENCOUNTER — Encounter: Payer: Self-pay | Admitting: Internal Medicine

## 2014-07-14 ENCOUNTER — Other Ambulatory Visit: Payer: Self-pay | Admitting: *Deleted

## 2014-07-14 MED ORDER — METOPROLOL SUCCINATE ER 200 MG PO TB24: 200.0000 mg | ORAL_TABLET | Freq: Every day | ORAL | Status: DC

## 2014-07-14 MED ORDER — SOTALOL HCL 80 MG PO TABS: 120.0000 mg | ORAL_TABLET | Freq: Two times a day (BID) | ORAL | Status: DC

## 2014-07-14 MED ORDER — LISINOPRIL 40 MG PO TABS: 40.0000 mg | ORAL_TABLET | Freq: Every day | ORAL | Status: DC

## 2014-07-15 ENCOUNTER — Encounter: Payer: Self-pay | Admitting: Internal Medicine

## 2014-08-03 ENCOUNTER — Encounter: Payer: Self-pay | Admitting: Internal Medicine

## 2014-08-07 ENCOUNTER — Encounter (HOSPITAL_COMMUNITY): Payer: Self-pay | Admitting: Internal Medicine

## 2014-08-14 ENCOUNTER — Other Ambulatory Visit: Payer: Self-pay

## 2014-08-14 MED ORDER — FUROSEMIDE 40 MG PO TABS: 40.0000 mg | ORAL_TABLET | Freq: Two times a day (BID) | ORAL | Status: DC

## 2014-08-14 MED ORDER — METOPROLOL SUCCINATE ER 200 MG PO TB24: 200.0000 mg | ORAL_TABLET | Freq: Every day | ORAL | Status: DC

## 2014-08-14 MED ORDER — LISINOPRIL 40 MG PO TABS: 40.0000 mg | ORAL_TABLET | Freq: Every day | ORAL | Status: DC

## 2014-08-14 MED ORDER — SOTALOL HCL 80 MG PO TABS: 120.0000 mg | ORAL_TABLET | Freq: Two times a day (BID) | ORAL | Status: DC

## 2014-08-31 ENCOUNTER — Encounter: Payer: Self-pay | Admitting: Internal Medicine

## 2014-09-01 ENCOUNTER — Telehealth: Payer: Self-pay | Admitting: *Deleted

## 2014-09-01 NOTE — Telephone Encounter (Signed)
Attempted to call pt regarding treated VT episode from 12-6---number listed is no longer in service. Patient has a scheduled appt with GT on 1-12.

## 2014-09-09 ENCOUNTER — Encounter: Payer: Self-pay | Admitting: Internal Medicine

## 2014-09-09 ENCOUNTER — Ambulatory Visit (INDEPENDENT_AMBULATORY_CARE_PROVIDER_SITE_OTHER): Payer: Medicaid Other | Admitting: Internal Medicine

## 2014-09-09 VITALS — BP 130/84 | HR 60 | Ht 71.0 in | Wt 300.0 lb

## 2014-09-09 DIAGNOSIS — I472 Ventricular tachycardia, unspecified: Secondary | ICD-10-CM

## 2014-09-09 DIAGNOSIS — I5022 Chronic systolic (congestive) heart failure: Secondary | ICD-10-CM

## 2014-09-09 DIAGNOSIS — I10 Essential (primary) hypertension: Secondary | ICD-10-CM

## 2014-09-09 DIAGNOSIS — I429 Cardiomyopathy, unspecified: Secondary | ICD-10-CM

## 2014-09-09 DIAGNOSIS — I428 Other cardiomyopathies: Secondary | ICD-10-CM

## 2014-09-09 DIAGNOSIS — I4729 Other ventricular tachycardia: Secondary | ICD-10-CM

## 2014-09-09 LAB — MDC_IDC_ENUM_SESS_TYPE_INCLINIC
Brady Statistic RA Percent Paced: 0.31 %
Brady Statistic RV Percent Paced: 0.01 %
HIGH POWER IMPEDANCE MEASURED VALUE: 44.9658
HighPow Impedance: 45 Ohm
Lead Channel Impedance Value: 337.5 Ohm
Lead Channel Impedance Value: 487.5 Ohm
Lead Channel Pacing Threshold Amplitude: 0.75 V
Lead Channel Pacing Threshold Amplitude: 0.75 V
Lead Channel Pacing Threshold Amplitude: 1.5 V
Lead Channel Pacing Threshold Pulse Width: 0.5 ms
Lead Channel Pacing Threshold Pulse Width: 0.5 ms
Lead Channel Pacing Threshold Pulse Width: 0.7 ms
Lead Channel Sensing Intrinsic Amplitude: 7.1 mV
Lead Channel Setting Pacing Amplitude: 2.5 V
Lead Channel Setting Pacing Pulse Width: 0.7 ms
Lead Channel Setting Sensing Sensitivity: 0.5 mV
MDC IDC MSMT BATTERY REMAINING LONGEVITY: 100.8 mo
MDC IDC MSMT LEADCHNL RA SENSING INTR AMPL: 4.7 mV
MDC IDC MSMT LEADCHNL RV PACING THRESHOLD AMPLITUDE: 1.5 V
MDC IDC MSMT LEADCHNL RV PACING THRESHOLD PULSEWIDTH: 0.7 ms
MDC IDC PG SERIAL: 7224955
MDC IDC SESS DTM: 20160112125434
MDC IDC SET LEADCHNL RA PACING AMPLITUDE: 2 V
MDC IDC SET ZONE DETECTION INTERVAL: 350 ms
Zone Setting Detection Interval: 280 ms

## 2014-09-09 NOTE — Assessment & Plan Note (Signed)
His blood pressure is well controlled. No change in medical therapy. He is encouraged to reduce his sodium intake and to lose weight.

## 2014-09-09 NOTE — Assessment & Plan Note (Signed)
His St. Jude ICD is working normally. Will recheck in several months.  

## 2014-09-09 NOTE — Assessment & Plan Note (Signed)
ICD interogation demonstrates a single episode of VT which stopped spontaneously. Also there is a one-one tachycardia most likely representing SVT which was terminated with ATP.
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
## 2014-09-09 NOTE — Patient Instructions (Addendum)
Your physician wants you to follow-up in: 12 months with Dr. Taylor You will receive a reminder letter in the mail two months in advance. If you don't receive a letter, please call our office to schedule the follow-up appointment.  Remote monitoring is used to monitor your Pacemaker or ICD from home. This monitoring reduces the number of office visits required to check your device to one time per year. It allows us to keep an eye on the functioning of your device to ensure it is working properly. You are scheduled for a device check from home on 12/09/14. You may send your transmission at any time that day. If you have a wireless device, the transmission will be sent automatically. After your physician reviews your transmission, you will receive a postcard with your next transmission date.    

## 2014-09-09 NOTE — Assessment & Plan Note (Signed)
His symptoms are class 2. Non change in medical therapy. He is encouraged to lose weight and maintain a low sodium diet.

## 2014-09-26 ENCOUNTER — Other Ambulatory Visit: Payer: Self-pay | Admitting: *Deleted

## 2014-09-26 MED ORDER — HYDRALAZINE HCL 25 MG PO TABS: 25.0000 mg | ORAL_TABLET | Freq: Three times a day (TID) | ORAL | Status: DC

## 2014-09-26 MED ORDER — SPIRONOLACTONE 25 MG PO TABS: 25.0000 mg | ORAL_TABLET | Freq: Every day | ORAL | Status: DC

## 2014-10-14 ENCOUNTER — Other Ambulatory Visit: Payer: Self-pay | Admitting: Internal Medicine

## 2014-10-15 IMAGING — CR DG CHEST 2V
1 series · 1 of 1 positions shown · non-contrast
Comparison: Portable chest x-ray April 26, 2013 at [DATE] a.m.

CLINICAL DATA: Status post placement of implantable pacemaker
defibrillator

EXAM:
CHEST  2 VIEW

[w chest lat]
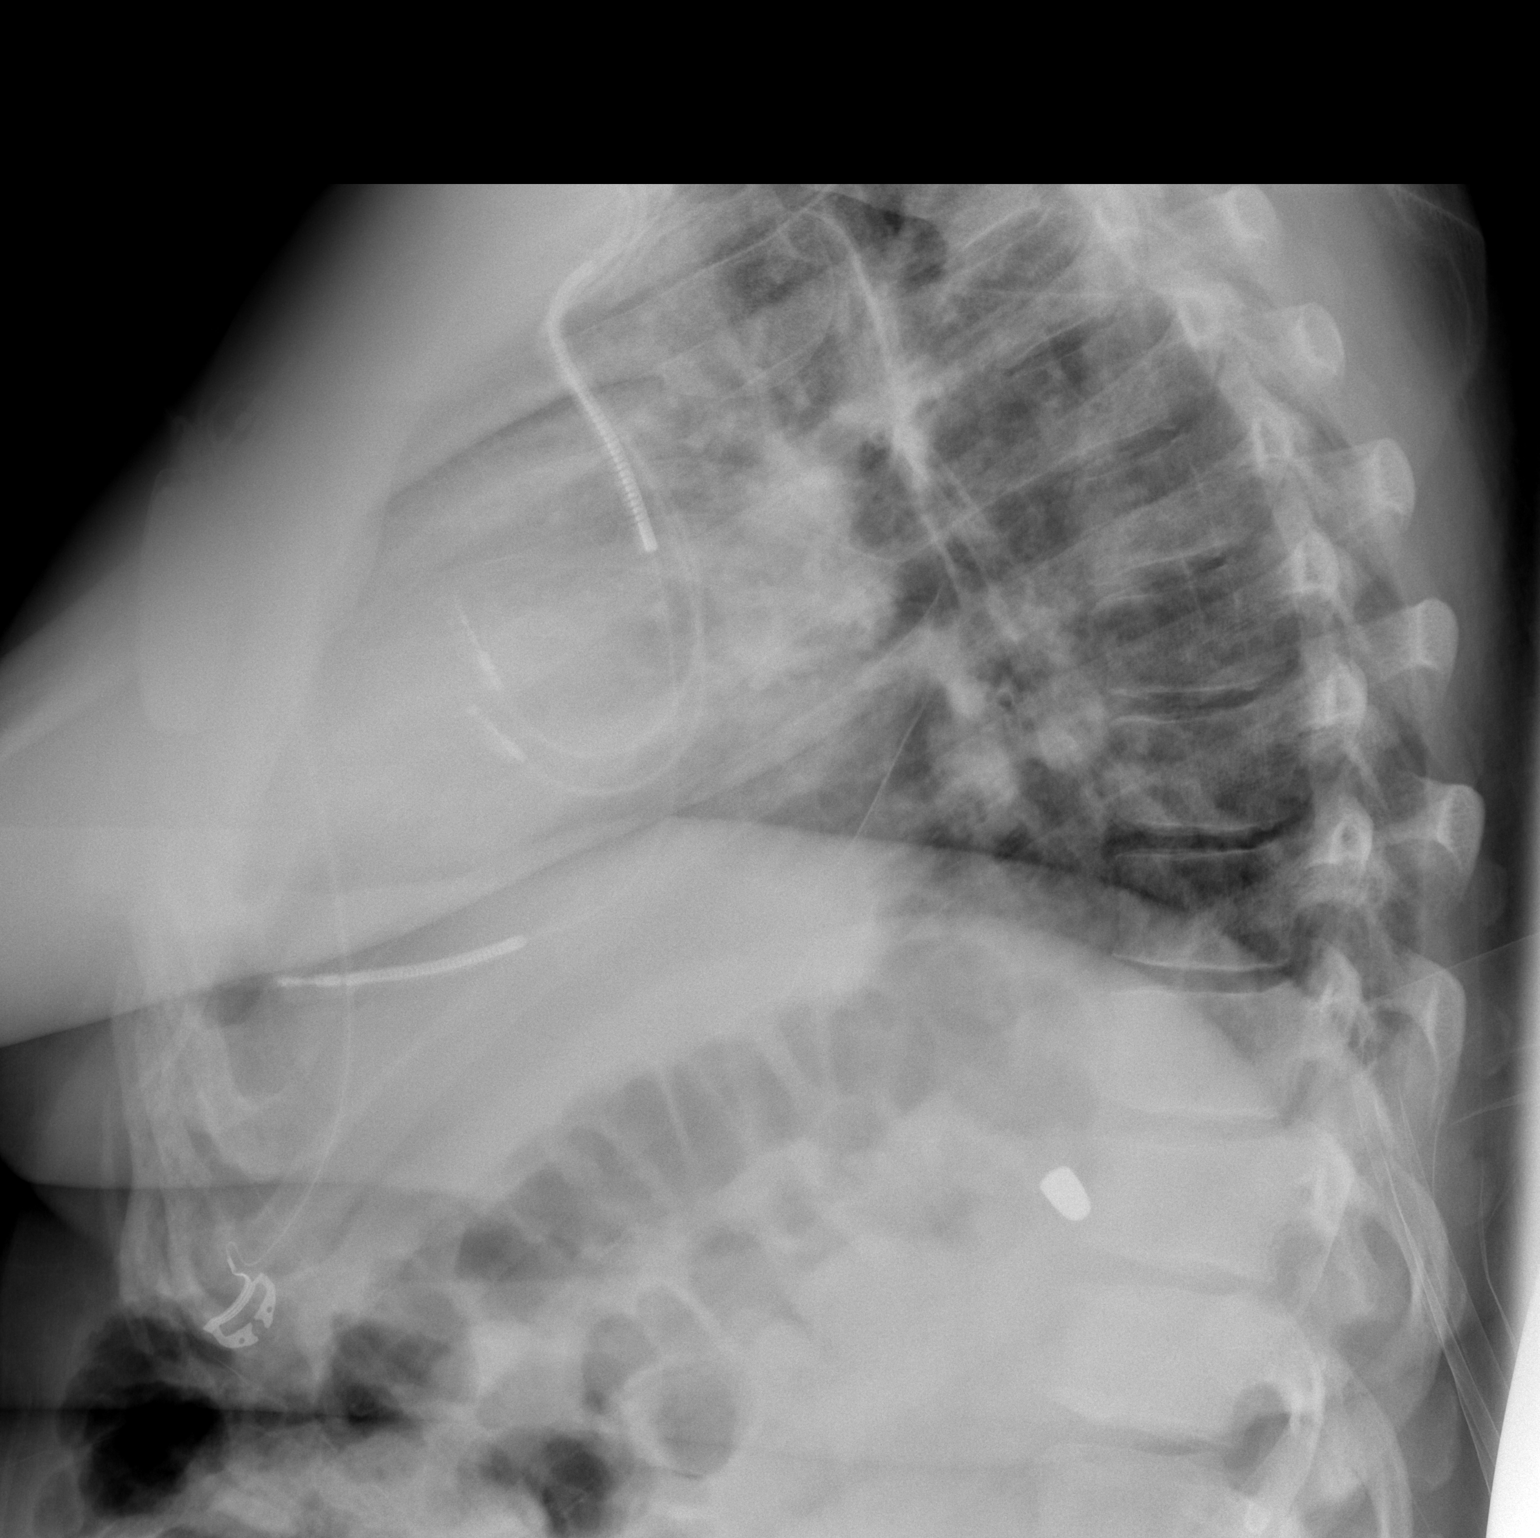

[1 of 1 positions shown; findings below may reference images not displayed]

FINDINGS: The lungs are adequately inflated. There is no focal infiltrate.
There is no pleural effusion or pneumothorax. The cardiopericardial
silhouette remains enlarged. There is mild prominence of the central
pulmonary vascularity. The permanent pacemaker defibrillator leads
are in appropriate position. The generator overlies the left
pectoral region.
IMPRESSION: Low-grade CHF.  No pneumothorax or pleural effusion is demonstrated.

## 2014-10-15 NOTE — Telephone Encounter (Signed)
Last OV 09/09/14 with G. [name] - 1 yr F/U - no changes were made to this medication  Refill sent in for:  Sotalol 80 mg - 1.5 tabs BID - #90, 6 refills

## 2014-10-21 ENCOUNTER — Other Ambulatory Visit: Payer: Self-pay | Admitting: *Deleted

## 2014-10-21 MED ORDER — LISINOPRIL 40 MG PO TABS: 40.0000 mg | ORAL_TABLET | Freq: Every day | ORAL | Status: DC

## 2014-10-21 MED ORDER — METOPROLOL SUCCINATE ER 200 MG PO TB24: 200.0000 mg | ORAL_TABLET | Freq: Every day | ORAL | Status: DC

## 2014-11-17 ENCOUNTER — Telehealth: Payer: Self-pay | Admitting: *Deleted

## 2014-11-17 NOTE — Telephone Encounter (Signed)
Spoke to patient about treated (ATP) episode from 11/11/2014. Patient denies any symptoms on that day during that time. I informed patient about driving restrictions. Patient voiced understanding. Alert placed in red folder/note forwarded to GT.

## 2014-11-18 ENCOUNTER — Encounter: Payer: Self-pay | Admitting: Internal Medicine

## 2014-11-27 ENCOUNTER — Encounter: Payer: Self-pay | Admitting: Internal Medicine

## 2014-12-08 ENCOUNTER — Telehealth: Payer: Self-pay | Admitting: Cardiology

## 2014-12-08 NOTE — Telephone Encounter (Signed)
LMOVM informing pt that remote transmission scheduled for tomorrow is automatic and his home monitor should send transmission while he is sleeping during 12 AM and 5 AM.

## 2014-12-09 ENCOUNTER — Encounter: Payer: Self-pay | Admitting: Internal Medicine

## 2014-12-09 ENCOUNTER — Ambulatory Visit (INDEPENDENT_AMBULATORY_CARE_PROVIDER_SITE_OTHER): Payer: Medicaid Other | Admitting: *Deleted

## 2014-12-09 DIAGNOSIS — I429 Cardiomyopathy, unspecified: Secondary | ICD-10-CM

## 2014-12-09 DIAGNOSIS — I5022 Chronic systolic (congestive) heart failure: Secondary | ICD-10-CM

## 2014-12-09 DIAGNOSIS — I428 Other cardiomyopathies: Secondary | ICD-10-CM

## 2014-12-09 LAB — MDC_IDC_ENUM_SESS_TYPE_REMOTE
Brady Statistic RA Percent Paced: 1 %
Implantable Pulse Generator Serial Number: 7224955
Lead Channel Sensing Intrinsic Amplitude: 7.1 mV
MDC IDC MSMT LEADCHNL RA IMPEDANCE VALUE: 550 Ohm
MDC IDC MSMT LEADCHNL RA SENSING INTR AMPL: 5 mV
MDC IDC MSMT LEADCHNL RV IMPEDANCE VALUE: 350 Ohm
MDC IDC SET LEADCHNL RA PACING AMPLITUDE: 2 V
MDC IDC SET LEADCHNL RV PACING AMPLITUDE: 2.5 V
MDC IDC SET LEADCHNL RV PACING PULSEWIDTH: 0.7 ms
MDC IDC SET LEADCHNL RV SENSING SENSITIVITY: 0.5 mV
MDC IDC STAT BRADY RV PERCENT PACED: 1 %
Zone Setting Detection Interval: 280 ms
Zone Setting Detection Interval: 350 ms

## 2014-12-09 NOTE — Progress Notes (Signed)
Remote ICD transmission.   

## 2014-12-12 ENCOUNTER — Other Ambulatory Visit: Payer: Self-pay

## 2014-12-12 MED ORDER — FUROSEMIDE 40 MG PO TABS: 40.0000 mg | ORAL_TABLET | Freq: Two times a day (BID) | ORAL | Status: DC

## 2014-12-22 ENCOUNTER — Encounter: Payer: Self-pay | Admitting: Cardiology

## 2015-02-04 ENCOUNTER — Telehealth: Payer: Self-pay | Admitting: Internal Medicine

## 2015-02-04 NOTE — Telephone Encounter (Signed)
New message      Pt want a note from Dr [name] saying it is ok not to use his seatbelt sometimes when his defibulator area in his chest is hurting.  He needs this in case he is stopped by the police

## 2015-02-04 NOTE — Telephone Encounter (Signed)
lmom for patient that unfortunately this is something we are unable to do. It does not uphold in court and we encourage all patients to wear a seatbelt

## 2015-02-12 ENCOUNTER — Telehealth: Payer: Self-pay | Admitting: Internal Medicine

## 2015-02-12 ENCOUNTER — Other Ambulatory Visit: Payer: Self-pay | Admitting: Internal Medicine

## 2015-02-12 NOTE — Telephone Encounter (Signed)
New Prob    Calling to verify if pt should be on both Metoprolol and Sotalol. Please call.

## 2015-02-13 NOTE — Telephone Encounter (Signed)
Discussed with Dr [name] he needs to be on both.  I have called the pharmacy and let them know

## 2015-02-26 ENCOUNTER — Other Ambulatory Visit: Payer: Self-pay | Admitting: Internal Medicine

## 2015-02-27 ENCOUNTER — Other Ambulatory Visit: Payer: Self-pay

## 2015-02-27 MED ORDER — POTASSIUM CHLORIDE CRYS ER 20 MEQ PO TBCR: 20.0000 meq | EXTENDED_RELEASE_TABLET | Freq: Two times a day (BID) | ORAL | Status: DC

## 2015-03-05 ENCOUNTER — Encounter: Payer: Self-pay | Admitting: Internal Medicine

## 2015-03-06 ENCOUNTER — Telehealth: Payer: Self-pay | Admitting: *Deleted

## 2015-03-06 NOTE — Telephone Encounter (Signed)
Spoke to pt about ICD shock from 7/6. Patient states that he was aware of the shock. States that he was sitting in his mother's chair when the incident occurred. He stated that he had taken all of his medications for the am prior to the shock. He denied CP or ShOB, but does admit to presyncope. Will inform GT of episode and call pt w/further recommendations.  Patient is aware of 6 mos driving restriction.

## 2015-03-11 ENCOUNTER — Ambulatory Visit (INDEPENDENT_AMBULATORY_CARE_PROVIDER_SITE_OTHER): Payer: Medicaid Other | Admitting: *Deleted

## 2015-03-11 ENCOUNTER — Encounter: Payer: Self-pay | Admitting: Internal Medicine

## 2015-03-11 DIAGNOSIS — I5022 Chronic systolic (congestive) heart failure: Secondary | ICD-10-CM | POA: Diagnosis not present

## 2015-03-11 DIAGNOSIS — I429 Cardiomyopathy, unspecified: Secondary | ICD-10-CM

## 2015-03-11 DIAGNOSIS — I428 Other cardiomyopathies: Secondary | ICD-10-CM

## 2015-03-11 NOTE — Progress Notes (Signed)
Remote ICD transmission.   

## 2015-03-17 LAB — CUP PACEART REMOTE DEVICE CHECK
Battery Remaining Longevity: 85 mo
Brady Statistic AP VS Percent: 2.7 %
Brady Statistic AS VP Percent: 1 %
Brady Statistic AS VS Percent: 96 %
Brady Statistic RA Percent Paced: 1 %
Date Time Interrogation Session: 20160713073712
HIGH POWER IMPEDANCE MEASURED VALUE: 49 Ohm
HighPow Impedance: 49 Ohm
Lead Channel Impedance Value: 340 Ohm
Lead Channel Impedance Value: 460 Ohm
Lead Channel Sensing Intrinsic Amplitude: 5 mV
Lead Channel Setting Pacing Amplitude: 2 V
Lead Channel Setting Pacing Pulse Width: 0.7 ms
MDC IDC MSMT BATTERY REMAINING PERCENTAGE: 89 %
MDC IDC MSMT BATTERY VOLTAGE: 3.13 V
MDC IDC MSMT LEADCHNL RV SENSING INTR AMPL: 8.9 mV
MDC IDC SET LEADCHNL RV PACING AMPLITUDE: 2.5 V
MDC IDC SET LEADCHNL RV SENSING SENSITIVITY: 0.5 mV
MDC IDC SET ZONE DETECTION INTERVAL: 280 ms
MDC IDC STAT BRADY AP VP PERCENT: 1 %
MDC IDC STAT BRADY RV PERCENT PACED: 1 %
Pulse Gen Serial Number: 7224955
Zone Setting Detection Interval: 350 ms

## 2015-04-03 ENCOUNTER — Encounter: Payer: Self-pay | Admitting: Cardiology

## 2015-04-08 ENCOUNTER — Encounter: Payer: Self-pay | Admitting: Internal Medicine

## 2015-05-25 ENCOUNTER — Telehealth: Payer: Self-pay | Admitting: *Deleted

## 2015-05-25 NOTE — Telephone Encounter (Signed)
Tried to call patient regarding episode on remote transmission 05/23/15. Pt number not in service, emergency contact is wrong number.

## 2015-05-27 ENCOUNTER — Encounter: Payer: Self-pay | Admitting: *Deleted

## 2015-06-02 ENCOUNTER — Encounter: Payer: Self-pay | Admitting: *Deleted

## 2015-06-19 ENCOUNTER — Encounter: Payer: Self-pay | Admitting: Internal Medicine

## 2015-06-26 ENCOUNTER — Telehealth: Payer: Self-pay | Admitting: *Deleted

## 2015-06-26 ENCOUNTER — Encounter: Payer: Self-pay | Admitting: Internal Medicine

## 2015-06-26 NOTE — Telephone Encounter (Signed)
Attempted to call patient regarding recent episodes. Phone # disconnected. Will send letter to patient to call the ofc.

## 2015-07-08 ENCOUNTER — Telehealth: Payer: Self-pay | Admitting: *Deleted

## 2015-07-08 ENCOUNTER — Encounter: Payer: Self-pay | Admitting: *Deleted

## 2015-07-08 NOTE — Telephone Encounter (Signed)
Spoke to pt's pharmacy to get pt's current phone number. Pharmacy provided # [phone]. This phone number is also out of svc.

## 2015-08-26 ENCOUNTER — Other Ambulatory Visit: Payer: Self-pay | Admitting: *Deleted

## 2015-08-26 MED ORDER — FUROSEMIDE 40 MG PO TABS: 40.0000 mg | ORAL_TABLET | Freq: Two times a day (BID) | ORAL | Status: DC

## 2015-09-17 ENCOUNTER — Other Ambulatory Visit: Payer: Self-pay | Admitting: Internal Medicine

## 2015-10-14 ENCOUNTER — Other Ambulatory Visit: Payer: Self-pay | Admitting: Internal Medicine

## 2015-10-14 MED ORDER — SPIRONOLACTONE 25 MG PO TABS: 25.0000 mg | ORAL_TABLET | Freq: Every day | ORAL | Status: DC

## 2015-10-23 ENCOUNTER — Other Ambulatory Visit: Payer: Self-pay | Admitting: Internal Medicine

## 2015-11-17 ENCOUNTER — Other Ambulatory Visit: Payer: Self-pay | Admitting: Internal Medicine

## 2015-11-30 ENCOUNTER — Other Ambulatory Visit: Payer: Self-pay | Admitting: Internal Medicine

## 2015-11-30 ENCOUNTER — Telehealth: Payer: Self-pay | Admitting: Internal Medicine

## 2015-11-30 NOTE — Telephone Encounter (Signed)
°*  STAT* If patient is at the pharmacy, call can be transferred to refill team.   1. Which medications need to be refilled? (please list name of each medication and dose if known) potassium 2. Which pharmacy/location (including street and city if local pharmacy) is medication to be sent to? [place] [address]  3. Do they need a 30 day or 90 day supply? 30

## 2015-11-30 NOTE — Telephone Encounter (Signed)
Medication Detail      Disp Refills Start End     potassium chloride SA (K-DUR,KLOR-CON) 20 MEQ tablet 60 tablet 0 11/30/2015     Sig: TAKE 1 TABLET(20 MEQ) BY MOUTH TWICE DAILY    Notes to Pharmacy: Please keep 12/29/15 appointment for further refills    E-Prescribing Status: Receipt confirmed by pharmacy (11/30/2015 4:30 PM EDT)     Pharmacy    [id number] - [place], [place] - [address]

## 2015-11-30 NOTE — Telephone Encounter (Signed)
Pt calling to check on status-pt out of med and leaving [place]-pls call in asap

## 2015-12-02 ENCOUNTER — Encounter: Payer: Medicaid Other | Admitting: Internal Medicine

## 2015-12-18 ENCOUNTER — Telehealth: Payer: Self-pay | Admitting: Internal Medicine

## 2015-12-18 ENCOUNTER — Other Ambulatory Visit: Payer: Self-pay | Admitting: *Deleted

## 2015-12-18 MED ORDER — LISINOPRIL 40 MG PO TABS: 40.0000 mg | ORAL_TABLET | Freq: Every day | ORAL | Status: DC

## 2015-12-18 MED ORDER — METOPROLOL SUCCINATE ER 200 MG PO TB24: 200.0000 mg | ORAL_TABLET | Freq: Every day | ORAL | Status: DC

## 2015-12-18 MED ORDER — HYDRALAZINE HCL 25 MG PO TABS: 25.0000 mg | ORAL_TABLET | Freq: Three times a day (TID) | ORAL | Status: DC

## 2015-12-18 NOTE — Telephone Encounter (Signed)
°*  STAT* If patient is at the pharmacy, call can be transferred to refill team.   1. Which medications need to be refilled? (please list name of each medication and dose if known) Metoprolol, Hydralazine, and Lisinopril  2. Which pharmacy/location (including street and city if local pharmacy) is medication to be sent to?[facility] in [place]  3. Do they need a 30 day or 90 day supply? 30

## 2015-12-29 ENCOUNTER — Encounter: Payer: Medicaid Other | Admitting: Internal Medicine

## 2015-12-30 ENCOUNTER — Encounter: Payer: Self-pay | Admitting: Internal Medicine

## 2016-05-24 ENCOUNTER — Encounter (INDEPENDENT_AMBULATORY_CARE_PROVIDER_SITE_OTHER): Payer: Medicaid Other | Admitting: Internal Medicine

## 2016-05-24 DIAGNOSIS — Z95810 Presence of automatic (implantable) cardiac defibrillator: Secondary | ICD-10-CM

## 2016-05-25 ENCOUNTER — Encounter: Payer: Self-pay | Admitting: Internal Medicine

## 2016-07-13 ENCOUNTER — Telehealth: Payer: Self-pay | Admitting: Internal Medicine

## 2016-07-13 ENCOUNTER — Other Ambulatory Visit: Payer: Self-pay | Admitting: Internal Medicine

## 2016-07-13 MED ORDER — BUMETANIDE 1 MG PO TABS: 1.0000 mg | ORAL_TABLET | Freq: Two times a day (BID) | ORAL | 0 refills | Status: DC

## 2016-07-13 MED ORDER — HYDRALAZINE HCL 10 MG PO TABS: 30.0000 mg | ORAL_TABLET | Freq: Three times a day (TID) | ORAL | 0 refills | Status: DC

## 2016-07-13 NOTE — Telephone Encounter (Signed)
Pt called needs refills on   HYDRALAZINE 50mg    BUMETANIDE  1 mg   [address]  [facility] in [place].

## 2016-07-14 ENCOUNTER — Other Ambulatory Visit: Payer: Self-pay | Admitting: Internal Medicine

## 2016-07-14 MED ORDER — HYDRALAZINE HCL 25 MG PO TABS: 25.0000 mg | ORAL_TABLET | Freq: Three times a day (TID) | ORAL | 0 refills | Status: DC

## 2016-07-14 NOTE — Telephone Encounter (Signed)
Per Michail Jewels, LPN, Dr. Lubertha Basque nurse, to send in a new Rx for Hydralazine 25 mg tablet, pt taking 1 tablet TID, per LOV note on 09/09/14. Pt has an upcoming appointment in January.

## 2016-09-06 ENCOUNTER — Telehealth: Payer: Self-pay

## 2016-09-06 NOTE — Telephone Encounter (Signed)
   Received a fax from [facility] in [place]. It is for a request to do a PA for Colchicine (for gout) . I have called [facility] and asked them to forward this to his PCP. They said they would do that.

## 2016-09-09 ENCOUNTER — Ambulatory Visit (INDEPENDENT_AMBULATORY_CARE_PROVIDER_SITE_OTHER): Payer: Medicaid Other | Admitting: Internal Medicine

## 2016-09-09 ENCOUNTER — Encounter: Payer: Self-pay | Admitting: Internal Medicine

## 2016-09-09 VITALS — BP 122/90 | HR 77 | Ht 71.0 in | Wt 315.0 lb

## 2016-09-09 DIAGNOSIS — Z95810 Presence of automatic (implantable) cardiac defibrillator: Secondary | ICD-10-CM

## 2016-09-09 DIAGNOSIS — I5022 Chronic systolic (congestive) heart failure: Secondary | ICD-10-CM | POA: Diagnosis not present

## 2016-09-09 DIAGNOSIS — I428 Other cardiomyopathies: Secondary | ICD-10-CM

## 2016-09-09 LAB — CUP PACEART INCLINIC DEVICE CHECK
Brady Statistic RA Percent Paced: 0.32 %
Brady Statistic RV Percent Paced: 0.02 %
Date Time Interrogation Session: 20180112172247
HighPow Impedance: 47.7548
Implantable Lead Implant Date: 20080701
Implantable Lead Location: 753859
Implantable Lead Location: 753860
Lead Channel Impedance Value: 350 Ohm
Lead Channel Pacing Threshold Amplitude: 0.75 V
Lead Channel Pacing Threshold Amplitude: 0.75 V
Lead Channel Pacing Threshold Pulse Width: 0.5 ms
Lead Channel Pacing Threshold Pulse Width: 0.5 ms
Lead Channel Pacing Threshold Pulse Width: 0.7 ms
Lead Channel Setting Pacing Amplitude: 2.5 V
Lead Channel Setting Sensing Sensitivity: 0.5 mV
MDC IDC LEAD IMPLANT DT: 20150928
MDC IDC MSMT LEADCHNL RA IMPEDANCE VALUE: 462.5 Ohm
MDC IDC MSMT LEADCHNL RA SENSING INTR AMPL: 5 mV
MDC IDC MSMT LEADCHNL RV PACING THRESHOLD AMPLITUDE: 1.25 V
MDC IDC MSMT LEADCHNL RV PACING THRESHOLD AMPLITUDE: 1.25 V
MDC IDC MSMT LEADCHNL RV PACING THRESHOLD PULSEWIDTH: 0.7 ms
MDC IDC MSMT LEADCHNL RV SENSING INTR AMPL: 12 mV
MDC IDC PG IMPLANT DT: 20150928
MDC IDC SET LEADCHNL RA PACING AMPLITUDE: 2 V
MDC IDC SET LEADCHNL RV PACING PULSEWIDTH: 0.7 ms
Pulse Gen Serial Number: 7224955

## 2016-09-09 MED ORDER — ALLOPURINOL 100 MG PO TABS: 100.0000 mg | ORAL_TABLET | Freq: Every day | ORAL | 2 refills | Status: AC

## 2016-09-09 MED ORDER — COLCHICINE 0.6 MG PO TABS: 0.6000 mg | ORAL_TABLET | ORAL | 2 refills | Status: DC

## 2016-09-09 NOTE — Patient Instructions (Signed)
Medication Instructions:    Your physician recommends that you continue on your current medications as directed. Please refer to the Current Medication list given to you today.  --- If you need a refill on your cardiac medications before your next appointment, please call your pharmacy. ---  Labwork:  None ordered  Testing/Procedures:  None ordered  Follow-Up: Remote monitoring is used to monitor your Pacemaker of ICD from home. This monitoring reduces the number of office visits required to check your device to one time per year. It allows us to keep an eye on the functioning of your device to ensure it is working properly. You are scheduled for a device check from home on 12/12/2016. You may send your transmission at any time that day. If you have a wireless device, the transmission will be sent automatically. After your physician reviews your transmission, you will receive a postcard with your next transmission date.   Your physician wants you to follow-up in: 6 months with Dr. Taylor.  You will receive a reminder letter in the mail two months in advance. If you don't receive a letter, please call our office to schedule the follow-up appointment.  Thank you for choosing [facility]!!            
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
## 2016-09-21 ENCOUNTER — Telehealth: Payer: Self-pay

## 2016-09-21 ENCOUNTER — Other Ambulatory Visit: Payer: Self-pay

## 2016-09-21 MED ORDER — COLCHICINE 0.6 MG PO CAPS: 0.6000 mg | ORAL_CAPSULE | Freq: Two times a day (BID) | ORAL | 2 refills | Status: DC

## 2016-09-21 NOTE — Telephone Encounter (Signed)
Colchicine tablets NOT covered by [place] [employer]/Med[employer]. Changed to Colchicine capsules, and no PA needed.

## 2016-09-28 ENCOUNTER — Other Ambulatory Visit: Payer: Self-pay | Admitting: *Deleted

## 2016-09-28 MED ORDER — HYDRALAZINE HCL 25 MG PO TABS: 25.0000 mg | ORAL_TABLET | Freq: Three times a day (TID) | ORAL | 3 refills | Status: DC

## 2016-11-28 ENCOUNTER — Other Ambulatory Visit: Payer: Self-pay | Admitting: *Deleted

## 2016-11-28 MED ORDER — METOPROLOL SUCCINATE ER 200 MG PO TB24: 200.0000 mg | ORAL_TABLET | Freq: Every day | ORAL | 2 refills | Status: AC

## 2016-11-28 MED ORDER — SPIRONOLACTONE 25 MG PO TABS: 25.0000 mg | ORAL_TABLET | Freq: Every day | ORAL | 2 refills | Status: AC

## 2016-11-28 MED ORDER — LISINOPRIL 40 MG PO TABS: 40.0000 mg | ORAL_TABLET | Freq: Every day | ORAL | 2 refills | Status: AC

## 2016-12-12 ENCOUNTER — Ambulatory Visit (INDEPENDENT_AMBULATORY_CARE_PROVIDER_SITE_OTHER): Payer: Medicaid Other | Admitting: *Deleted

## 2016-12-12 ENCOUNTER — Telehealth: Payer: Self-pay | Admitting: Cardiology

## 2016-12-12 DIAGNOSIS — I428 Other cardiomyopathies: Secondary | ICD-10-CM | POA: Diagnosis not present

## 2016-12-12 DIAGNOSIS — I5022 Chronic systolic (congestive) heart failure: Secondary | ICD-10-CM

## 2016-12-12 NOTE — Telephone Encounter (Signed)
LMOVM reminding pt to send remote transmission.   

## 2016-12-13 NOTE — Progress Notes (Signed)
Remote ICD transmission.   

## 2016-12-14 ENCOUNTER — Encounter: Payer: Self-pay | Admitting: Cardiology

## 2016-12-15 LAB — CUP PACEART REMOTE DEVICE CHECK
Battery Remaining Longevity: 71 mo
Battery Voltage: 2.96 V
Brady Statistic AS VP Percent: 1 %
Brady Statistic RA Percent Paced: 1 %
Brady Statistic RV Percent Paced: 1 %
HIGH POWER IMPEDANCE MEASURED VALUE: 43 Ohm
HighPow Impedance: 43 Ohm
Implantable Lead Implant Date: 20080701
Implantable Lead Location: 753859
Implantable Lead Location: 753860
Implantable Lead Model: 7000
Lead Channel Impedance Value: 410 Ohm
Lead Channel Pacing Threshold Amplitude: 1.25 V
Lead Channel Pacing Threshold Pulse Width: 0.7 ms
Lead Channel Sensing Intrinsic Amplitude: 11.2 mV
Lead Channel Sensing Intrinsic Amplitude: 4.2 mV
Lead Channel Setting Pacing Amplitude: 2 V
Lead Channel Setting Pacing Amplitude: 2.5 V
Lead Channel Setting Pacing Pulse Width: 0.7 ms
MDC IDC LEAD IMPLANT DT: 20150928
MDC IDC MSMT BATTERY REMAINING PERCENTAGE: 75 %
MDC IDC MSMT LEADCHNL RA PACING THRESHOLD AMPLITUDE: 0.75 V
MDC IDC MSMT LEADCHNL RA PACING THRESHOLD PULSEWIDTH: 0.5 ms
MDC IDC MSMT LEADCHNL RV IMPEDANCE VALUE: 340 Ohm
MDC IDC PG IMPLANT DT: 20150928
MDC IDC SESS DTM: 20180416232331
MDC IDC SET LEADCHNL RV SENSING SENSITIVITY: 0.5 mV
MDC IDC STAT BRADY AP VP PERCENT: 1 %
MDC IDC STAT BRADY AP VS PERCENT: 1.4 %
MDC IDC STAT BRADY AS VS PERCENT: 97 %
Pulse Gen Serial Number: 7224955

## 2017-01-28 ENCOUNTER — Other Ambulatory Visit: Payer: Self-pay | Admitting: Internal Medicine

## 2017-02-27 ENCOUNTER — Telehealth: Payer: Self-pay | Admitting: Internal Medicine

## 2017-02-27 NOTE — Telephone Encounter (Signed)
Called, spoke with pt. Pt informed he has been having SOB issues for 1 week while lying down, wheezing, and coughing. Inquired if pt has BP, HR, or O2 sat. Pt stated he "lost his BP machine" and does not have a way to check sat. Pt stated he had a cold about 2 months ago.   Inquired if pt has PCP. Pt informed he does not have a PCP. Pt stated Dr. [name] was PCP. Informed Dr. [name] is EP Cardiologist, not PCP. Pt would like recommendation from Dr. [name] for PCP. Informed I will forward request and contact pt back with Dr. Lubertha Basque recommendation. Pt verbalized understanding.

## 2017-02-27 NOTE — Telephone Encounter (Signed)
Called, spoke with pt. Informed pt Dr. [name] recommended Dr Julieanne Manson (or any PCP) at [facility] [phone]. Dr. [name] also recommended pt keeping appt with Francis Dowse, PA on 03/21/17. Pt verbalized understanding and thanked me for calling.

## 2017-02-27 NOTE — Telephone Encounter (Signed)
New message    Pt c/o Shortness Of Breath: STAT if SOB developed within the last 24 hours or pt is noticeably SOB on the phone  1. Are you currently SOB (can you hear that pt is SOB on the phone)? no  2. How long have you been experiencing SOB? A week  3. Are you SOB when sitting or when up moving around? When he lays down  4. Are you currently experiencing any other symptoms? No

## 2017-02-28 ENCOUNTER — Other Ambulatory Visit: Payer: Self-pay

## 2017-02-28 MED ORDER — AMIODARONE HCL 400 MG PO TABS: ORAL_TABLET | ORAL | 1 refills | Status: AC

## 2017-02-28 NOTE — Telephone Encounter (Signed)
Pt called to request refill for Amiodarone (400 mg tab - take 1/2 tab qd) to be sent to [facility] - [address] [place], [place]. Refill sent #45 w/1 refill.

## 2017-03-13 ENCOUNTER — Telehealth: Payer: Self-pay | Admitting: Cardiology

## 2017-03-13 ENCOUNTER — Ambulatory Visit (INDEPENDENT_AMBULATORY_CARE_PROVIDER_SITE_OTHER): Payer: Medicaid Other | Admitting: *Deleted

## 2017-03-13 DIAGNOSIS — I5022 Chronic systolic (congestive) heart failure: Secondary | ICD-10-CM

## 2017-03-13 DIAGNOSIS — I428 Other cardiomyopathies: Secondary | ICD-10-CM

## 2017-03-13 NOTE — Progress Notes (Signed)
Remote ICD transmission.   

## 2017-03-13 NOTE — Telephone Encounter (Signed)
Patient needs help with his sleep apnea machine. Please call he is under the impression that MD was going to help him with this.

## 2017-03-13 NOTE — Telephone Encounter (Signed)
Spoke with pt and reminded pt of remote transmission that is due today. Pt verbalized understanding.   

## 2017-03-14 NOTE — Telephone Encounter (Signed)
Spoke with patient who is reporting he had a "sleep apnea machine" when he lived in [place].  He unfortunately lost it in the move here.  His last sleep study was 9 months to 1 year ago and was completed at [facility].  He reports Dr [name] told him he would help him get another machine.  Advised I will need to f/u with Dr [name] to see if he needs another sleep study and which physician he would like him to see to treat his sleep apnea.  Pt states understanding but also states he has been having trouble breathing at night without it.  Aware someone will c/b once we get further orders from Dr [name].

## 2017-03-15 ENCOUNTER — Encounter: Payer: Self-pay | Admitting: Cardiology

## 2017-03-15 LAB — CUP PACEART REMOTE DEVICE CHECK
Battery Voltage: 2.96 V
Brady Statistic AP VP Percent: 1 %
Brady Statistic AS VP Percent: 1 %
Brady Statistic RA Percent Paced: 1 %
Brady Statistic RV Percent Paced: 1 %
Date Time Interrogation Session: 20180716145146
HighPow Impedance: 43 Ohm
HighPow Impedance: 43 Ohm
Implantable Lead Implant Date: 20080701
Implantable Lead Implant Date: 20150928
Implantable Lead Location: 753859
Implantable Lead Model: 5076
Implantable Lead Model: 7000
Lead Channel Impedance Value: 450 Ohm
Lead Channel Pacing Threshold Amplitude: 1.25 V
Lead Channel Pacing Threshold Pulse Width: 0.7 ms
Lead Channel Sensing Intrinsic Amplitude: 4.2 mV
Lead Channel Sensing Intrinsic Amplitude: 9.2 mV
Lead Channel Setting Pacing Amplitude: 2 V
Lead Channel Setting Pacing Pulse Width: 0.7 ms
MDC IDC LEAD LOCATION: 753860
MDC IDC MSMT BATTERY REMAINING LONGEVITY: 69 mo
MDC IDC MSMT BATTERY REMAINING PERCENTAGE: 72 %
MDC IDC MSMT LEADCHNL RA PACING THRESHOLD AMPLITUDE: 0.75 V
MDC IDC MSMT LEADCHNL RA PACING THRESHOLD PULSEWIDTH: 0.5 ms
MDC IDC MSMT LEADCHNL RV IMPEDANCE VALUE: 340 Ohm
MDC IDC PG IMPLANT DT: 20150928
MDC IDC PG SERIAL: 7224955
MDC IDC SET LEADCHNL RV PACING AMPLITUDE: 2.5 V
MDC IDC SET LEADCHNL RV SENSING SENSITIVITY: 0.5 mV
MDC IDC STAT BRADY AP VS PERCENT: 1.4 %
MDC IDC STAT BRADY AS VS PERCENT: 97 %
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
## 2017-03-21 ENCOUNTER — Encounter: Payer: Medicaid Other | Admitting: Physician Assistant

## 2017-04-04 ENCOUNTER — Encounter: Payer: Medicaid Other | Admitting: Physician Assistant
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
## 2017-04-07 ENCOUNTER — Other Ambulatory Visit: Payer: Self-pay | Admitting: *Deleted

## 2017-04-07 DIAGNOSIS — K219 Gastro-esophageal reflux disease without esophagitis: Secondary | ICD-10-CM

## 2017-04-07 MED ORDER — OMEPRAZOLE 20 MG PO CPDR: 20.0000 mg | DELAYED_RELEASE_CAPSULE | Freq: Every day | ORAL | 0 refills | Status: AC

## 2017-04-07 NOTE — Telephone Encounter (Signed)
Per review of Pt chart his PCP is Benedetto Goad, FNP-work phone number is [phone].  I attempted to call their office but it was closed.   I was advised I could give him a couple refills and just let pharmacy know future refills will need to be through his PCP.

## 2017-04-07 NOTE — Telephone Encounter (Signed)
Patient called to request a refill on omeprazole. Dr [name] has never filled this for him but the patient stated that Dr [name] is the only physician that he sees. Please advise. Thanks, MI

## 2017-04-19 ENCOUNTER — Encounter: Payer: Medicaid Other | Admitting: Physician Assistant

## 2017-04-21 ENCOUNTER — Encounter: Payer: Self-pay | Admitting: Physician Assistant

## 2017-05-02 ENCOUNTER — Encounter: Payer: Medicaid Other | Admitting: Physician Assistant
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
## 2017-05-16 ENCOUNTER — Ambulatory Visit (INDEPENDENT_AMBULATORY_CARE_PROVIDER_SITE_OTHER): Payer: Self-pay | Admitting: Physician Assistant

## 2017-05-16 ENCOUNTER — Encounter (INDEPENDENT_AMBULATORY_CARE_PROVIDER_SITE_OTHER): Payer: Self-pay

## 2017-05-16 VITALS — BP 118/72 | HR 70 | Ht 71.0 in | Wt 328.0 lb

## 2017-05-16 DIAGNOSIS — I471 Supraventricular tachycardia, unspecified: Secondary | ICD-10-CM

## 2017-05-16 DIAGNOSIS — I428 Other cardiomyopathies: Secondary | ICD-10-CM

## 2017-05-16 DIAGNOSIS — I472 Ventricular tachycardia, unspecified: Secondary | ICD-10-CM

## 2017-05-16 DIAGNOSIS — I5022 Chronic systolic (congestive) heart failure: Secondary | ICD-10-CM

## 2017-05-16 DIAGNOSIS — Z79899 Other long term (current) drug therapy: Secondary | ICD-10-CM

## 2017-05-16 NOTE — Patient Instructions (Addendum)
Medication Instructions:   Your physician recommends that you continue on your current medications as directed. Please refer to the Current Medication list given to you today.   If you need a refill on your cardiac medications before your next appointment, please call your pharmacy.   Labwork: BMET  TSH  AND LFT  TODAY    Testing/Procedures: NONE ORDERED  TODAY    Follow-Up:  Remote monitoring is used to monitor your Pacemaker of ICD from home. This monitoring reduces the number of office visits required to check your device to one time per year. It allows [place] to keep an eye on the functioning of your device to ensure it is working properly. You are scheduled for a device check from home on . 10- 15- 2018   You may send your transmission at any time that day. If you have a wireless device, the transmission will be sent automatically. After your physician reviews your transmission, you will receive a postcard with your next transmission date.       Your physician wants you to follow-up in:  IN  6  MONTHS WITH DR [name] / [place]   You will receive a reminder letter in the mail two months in advance. If you don't receive a letter, please call our office to schedule the follow-up appointment.  Marland KitchenPLEASE CONTACT [facility] ( [facility])  [phone] TO FIND AN PROVIDER  FOR AN PCP   Any Other Special Instructions Will Be Listed Below (If Applicable).

## 2017-05-17 ENCOUNTER — Other Ambulatory Visit: Payer: Self-pay | Admitting: Physician Assistant

## 2017-05-17 ENCOUNTER — Telehealth: Payer: Self-pay

## 2017-05-17 LAB — BASIC METABOLIC PANEL
BUN / CREAT RATIO: 10 (ref 9–20)
BUN: 15 mg/dL (ref 6–24)
CO2: 23 mmol/L (ref 20–29)
CREATININE: 1.47 mg/dL — AB (ref 0.76–1.27)
Calcium: 9.2 mg/dL (ref 8.7–10.2)
Chloride: 98 mmol/L (ref 96–106)
GFR, EST AFRICAN AMERICAN: 64 mL/min/{1.73_m2} (ref >59)
GFR, EST NON AFRICAN AMERICAN: 56 mL/min/{1.73_m2} — AB (ref >59)
GLUCOSE: 105 mg/dL — AB (ref 65–99)
Potassium: 3.6 mmol/L (ref 3.5–5.2)
Sodium: 141 mmol/L (ref 134–144)

## 2017-05-17 LAB — HEPATIC FUNCTION PANEL
ALT: 19 IU/L (ref 0–44)
AST: 20 IU/L (ref 0–40)
Albumin: 4.2 g/dL (ref 3.5–5.5)
Alkaline Phosphatase: 79 IU/L (ref 39–117)
BILIRUBIN TOTAL: 0.5 mg/dL (ref 0.0–1.2)
BILIRUBIN, DIRECT: 0.14 mg/dL (ref 0.00–0.40)
TOTAL PROTEIN: 7.5 g/dL (ref 6.0–8.5)

## 2017-05-17 LAB — TSH: TSH: 0.882 u[IU]/mL (ref 0.450–4.500)

## 2017-05-17 MED ORDER — BUMETANIDE 2 MG PO TABS: 2.0000 mg | ORAL_TABLET | Freq: Two times a day (BID) | ORAL | 0 refills | Status: DC

## 2017-05-17 NOTE — Telephone Encounter (Signed)
Fax from [employer] indicating that a prior Kyle Vincent is required for Bumetanide 1mg . Per [place] [employer], no PA is necessary.

## 2017-05-17 NOTE — Telephone Encounter (Signed)
Pt's medication was sent to pt's pharmacy as requested. Confirmation received.  °

## 2017-05-19 ENCOUNTER — Other Ambulatory Visit: Payer: Self-pay

## 2017-05-19 ENCOUNTER — Telehealth: Payer: Self-pay | Admitting: *Deleted

## 2017-05-19 DIAGNOSIS — I10 Essential (primary) hypertension: Secondary | ICD-10-CM

## 2017-05-19 DIAGNOSIS — I5022 Chronic systolic (congestive) heart failure: Secondary | ICD-10-CM

## 2017-05-19 MED ORDER — BUMETANIDE 2 MG PO TABS: ORAL_TABLET | ORAL | 6 refills | Status: AC

## 2017-05-19 NOTE — Telephone Encounter (Signed)
LMOVM PT TO CALL CLINIC BACK FOR RESULTS  PT WAS ALSO TOLD I WILL BE LEAVING AT 12 O'CLOCK TODAY WILL NOT BE AVAILABLE TO NEXT WEEK.

## 2017-05-19 NOTE — Telephone Encounter (Signed)
-----   Message from [facility], [place] sent at 05/17/2017  5:17 PM EDT ----- Please let the patient know his kidney function is a little reduced, not far from his hospital labs done recently. Please have him take the Bumex 1mg  BID alternating days with 2mg  BID and recheck his BMET in 2 weeks Remind him to minimize salt and do daily weights, monitoring closely for rapid weight gain and for LE swelling.  If any to let us know.  Thanks renee

## 2017-06-02 ENCOUNTER — Other Ambulatory Visit: Payer: Self-pay

## 2017-06-12 ENCOUNTER — Encounter: Payer: Medicaid Other | Admitting: *Deleted

## 2017-06-12 ENCOUNTER — Telehealth: Payer: Self-pay | Admitting: Cardiology

## 2017-06-12 NOTE — Telephone Encounter (Signed)
LMOVM reminding pt to send remote transmission.   

## 2017-06-16 ENCOUNTER — Encounter: Payer: Self-pay | Admitting: Cardiology

## 2017-07-23 NOTE — Telephone Encounter (Signed)
He will need a repeat sleep study or he will need to get his sleep study from [place]. GT

## 2017-07-28 NOTE — Telephone Encounter (Signed)
Spoke w/ pt and informed him of MD recommendations. Informed him that the Sleep Study CMA will be calling him to talk to him about the sleep study. Pt verbalized understanding.

## 2017-08-09 ENCOUNTER — Other Ambulatory Visit: Payer: Self-pay | Admitting: *Deleted

## 2017-08-09 NOTE — Telephone Encounter (Signed)
Okay to refill colchicine or should this be deferred to patients pcp? Also, he reports that he has potassium 10 meq tablets that were prescribed to him by another physician but he has been taking 4 tabs qam and 2 tabs qpm. Okay to refill potassium? If so, should it be refilled for the 20 meq that is listed on his chart? Please advise. Thanks, MI

## 2017-08-10 MED ORDER — POTASSIUM CHLORIDE CRYS ER 20 MEQ PO TBCR: EXTENDED_RELEASE_TABLET | ORAL | 2 refills | Status: AC

## 2017-08-10 NOTE — Telephone Encounter (Signed)
Called pt and stated that he preferred to continue to take potassium 20 meq tablets because he takes less tablets. I sent in a refill for potassium 20 meq to pt's pharmacy as requested. I advised the pt that if he has any other problems, questions or concerns to call the office. Pt verbalized understanding.

## 2017-08-10 NOTE — Addendum Note (Signed)
Addended by: Demetrios Loll on: 08/10/2017 10:26 AM   Modules accepted: Orders

## 2017-09-05 ENCOUNTER — Other Ambulatory Visit: Payer: Self-pay | Admitting: Internal Medicine

## 2017-09-05 DIAGNOSIS — K219 Gastro-esophageal reflux disease without esophagitis: Secondary | ICD-10-CM

## 2017-09-05 NOTE — Telephone Encounter (Signed)
Pt's pharmacy is requesting a refill on colchicine. Would you like to refill this medication? Please advise

## 2017-09-05 NOTE — Telephone Encounter (Signed)
Let's approve 15 days worth.  He was notified last September 2018 that he would need PCP for this prescription.  Let's put "last refill" on his prescription note please.  Then future refills will be denied.

## 2017-11-19 ENCOUNTER — Other Ambulatory Visit: Payer: Self-pay | Admitting: Internal Medicine

## 2018-02-28 ENCOUNTER — Encounter: Payer: Self-pay | Admitting: Cardiology

## 2019-11-09 ENCOUNTER — Encounter: Payer: Self-pay | Admitting: Cardiology

## 2021-08-13 ENCOUNTER — Telehealth: Payer: Self-pay

## 2021-08-13 NOTE — Telephone Encounter (Signed)
LVM at listed number for patient to call device clinic back and verify that his ICD is being checked by [facility] in [place] as there is a note from 02/2021 regarding his ICD.

## 2023-10-28 DEATH — deceased
